# Patient Record
Sex: Female | Born: 1945 | Race: White | Hispanic: No | Smoking: Never smoker
Health system: Southern US, Community
[De-identification: ages and names within clinical notes are randomized; demographics above are authoritative.]

## PROBLEM LIST (undated history)

## (undated) DIAGNOSIS — Z8719 Personal history of other diseases of the digestive system: Secondary | ICD-10-CM

## (undated) DIAGNOSIS — M12811 Other specific arthropathies, not elsewhere classified, right shoulder: Secondary | ICD-10-CM

## (undated) DIAGNOSIS — M1990 Unspecified osteoarthritis, unspecified site: Secondary | ICD-10-CM

## (undated) DIAGNOSIS — N183 Chronic kidney disease, stage 3 unspecified: Secondary | ICD-10-CM

## (undated) DIAGNOSIS — K219 Gastro-esophageal reflux disease without esophagitis: Secondary | ICD-10-CM

## (undated) DIAGNOSIS — I2510 Atherosclerotic heart disease of native coronary artery without angina pectoris: Secondary | ICD-10-CM

## (undated) DIAGNOSIS — I10 Essential (primary) hypertension: Secondary | ICD-10-CM

## (undated) DIAGNOSIS — I350 Nonrheumatic aortic (valve) stenosis: Secondary | ICD-10-CM

## (undated) DIAGNOSIS — E7800 Pure hypercholesterolemia, unspecified: Secondary | ICD-10-CM

## (undated) DIAGNOSIS — D509 Iron deficiency anemia, unspecified: Secondary | ICD-10-CM

## (undated) DIAGNOSIS — R195 Other fecal abnormalities: Secondary | ICD-10-CM

## (undated) DIAGNOSIS — R519 Headache, unspecified: Secondary | ICD-10-CM

## (undated) DIAGNOSIS — R51 Headache: Secondary | ICD-10-CM

## (undated) DIAGNOSIS — E119 Type 2 diabetes mellitus without complications: Secondary | ICD-10-CM

## (undated) HISTORY — DX: Other fecal abnormalities: R19.5

## (undated) HISTORY — DX: Type 2 diabetes mellitus without complications: E11.9

## (undated) HISTORY — PX: CHOLECYSTECTOMY: SHX55

## (undated) HISTORY — PX: REPLACEMENT TOTAL KNEE: SUR1224

## (undated) HISTORY — DX: Nonrheumatic aortic (valve) stenosis: I35.0

## (undated) HISTORY — PX: CORONARY ANGIOPLASTY: SHX604

## (undated) HISTORY — DX: Chronic kidney disease, stage 3 (moderate): N18.3

## (undated) HISTORY — DX: Essential (primary) hypertension: I10

## (undated) HISTORY — DX: Iron deficiency anemia, unspecified: D50.9

## (undated) HISTORY — DX: Gastro-esophageal reflux disease without esophagitis: K21.9

## (undated) HISTORY — DX: Pure hypercholesterolemia, unspecified: E78.00

## (undated) HISTORY — DX: Atherosclerotic heart disease of native coronary artery without angina pectoris: I25.10

## (undated) HISTORY — DX: Chronic kidney disease, stage 3 unspecified: N18.30

## (undated) HISTORY — PX: OTHER SURGICAL HISTORY: SHX169

---
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
## 2018-07-25 ENCOUNTER — Encounter (INDEPENDENT_AMBULATORY_CARE_PROVIDER_SITE_OTHER): Payer: Self-pay | Admitting: *Deleted

## 2018-07-25 ENCOUNTER — Encounter (INDEPENDENT_AMBULATORY_CARE_PROVIDER_SITE_OTHER): Payer: Self-pay | Admitting: Internal Medicine

## 2018-07-25 ENCOUNTER — Telehealth (INDEPENDENT_AMBULATORY_CARE_PROVIDER_SITE_OTHER): Payer: Self-pay | Admitting: *Deleted

## 2018-07-25 ENCOUNTER — Ambulatory Visit (INDEPENDENT_AMBULATORY_CARE_PROVIDER_SITE_OTHER): Payer: Medicare Other | Admitting: Internal Medicine

## 2018-07-25 DIAGNOSIS — R195 Other fecal abnormalities: Secondary | ICD-10-CM | POA: Diagnosis not present

## 2018-07-25 HISTORY — DX: Other fecal abnormalities: R19.5

## 2018-07-25 MED ORDER — SUPREP BOWEL PREP KIT 17.5-3.13-1.6 GM/177ML PO SOLN: 1.0000 | Freq: Once | ORAL | 0 refills | Status: AC

## 2018-07-25 NOTE — Telephone Encounter (Signed)
Per Gweneth Dimitri, PA it is ok for patient to stop Plavix 5 days prior to TCS but continue ASA, patient aware

## 2018-07-25 NOTE — Progress Notes (Signed)
   Subjective:    Patient ID: Kimberly Baldwin, female    DOB: 01-16-1946, 72 y.o.   MRN: [id number]  HPI Referred by Dr. Leana Roe for heme positive stool. She says she has not seen any blood in her stools. No change in her stools. Her appetite is good. No weight loss. No abdominal pain. No family hx of colon cancer. She does say she has hemorrhoids. Her last colonoscopy 17 yrs ago Dr. Aleene Davidson and was normal per patient.   Cardiac stent and maintained on Plavix Diabetic x 30 yrs or better. HA1C 5.7 last one  Retired from [employer].   Review of Systems Past Medical History:  Diagnosis Date  . Aortic valve stenosis   . CAD (coronary artery disease)   . CRF (chronic renal failure), stage 3 (moderate) (HCC)   . Diabetes (HCC)   . GERD (gastroesophageal reflux disease)   . Guaiac positive stools 07/25/2018  . High cholesterol   . Hypertension   . IDA (iron deficiency anemia)       Allergies  Allergen Reactions  . Diclofenac     nausea  . Mobic [Meloxicam]     nauseated    No current outpatient medications on file prior to visit.   No current facility-administered medications on file prior to visit.         Objective:   Physical Exam Blood pressure 140/78, pulse 64, temperature 97.9 F (36.6 C), height 4\' 11"  (1.499 m), weight 196 lb 6.4 oz (89.1 kg). Alert and oriented. Skin warm and dry. Oral mucosa is moist.   . Sclera anicteric, conjunctivae is pink. Thyroid not enlarged. No cervical lymphadenopathy. Lungs clear. Heart regular rate and rhythm.  Abdomen is soft. Bowel sounds are positive. No hepatomegaly. No abdominal masses felt. No tenderness.  No edema to lower extremities.           Assessment & Plan:  Guaiac positive stool. Colonic neoplasm needs to be ruled out. Colonoscopy.

## 2018-07-25 NOTE — Telephone Encounter (Signed)
Patient needs suprep 

## 2018-07-25 NOTE — Patient Instructions (Signed)
The risks of bleeding, perforation and infection were reviewed with patient.  

## 2018-09-27 ENCOUNTER — Encounter (HOSPITAL_COMMUNITY)
Admission: RE | Disposition: A | Discharge status: Home / Self Care | Payer: Self-pay | Source: Ambulatory Visit | Attending: Internal Medicine

## 2018-09-27 ENCOUNTER — Encounter (HOSPITAL_COMMUNITY): Payer: Self-pay | Admitting: *Deleted

## 2018-09-27 ENCOUNTER — Other Ambulatory Visit: Payer: Self-pay

## 2018-09-27 ENCOUNTER — Ambulatory Visit (HOSPITAL_COMMUNITY)
Admission: RE | Admit: 2018-09-27 | Discharge: 2018-09-27 | Disposition: A | Discharge status: Home / Self Care | Payer: Medicare Other | Source: Ambulatory Visit | Attending: Internal Medicine | Admitting: Internal Medicine

## 2018-09-27 DIAGNOSIS — K219 Gastro-esophageal reflux disease without esophagitis: Secondary | ICD-10-CM | POA: Diagnosis not present

## 2018-09-27 DIAGNOSIS — Z7902 Long term (current) use of antithrombotics/antiplatelets: Secondary | ICD-10-CM | POA: Insufficient documentation

## 2018-09-27 DIAGNOSIS — M1990 Unspecified osteoarthritis, unspecified site: Secondary | ICD-10-CM | POA: Insufficient documentation

## 2018-09-27 DIAGNOSIS — N183 Chronic kidney disease, stage 3 (moderate): Secondary | ICD-10-CM | POA: Diagnosis not present

## 2018-09-27 DIAGNOSIS — I129 Hypertensive chronic kidney disease with stage 1 through stage 4 chronic kidney disease, or unspecified chronic kidney disease: Secondary | ICD-10-CM | POA: Insufficient documentation

## 2018-09-27 DIAGNOSIS — Z7982 Long term (current) use of aspirin: Secondary | ICD-10-CM | POA: Insufficient documentation

## 2018-09-27 DIAGNOSIS — E7800 Pure hypercholesterolemia, unspecified: Secondary | ICD-10-CM | POA: Insufficient documentation

## 2018-09-27 DIAGNOSIS — R195 Other fecal abnormalities: Secondary | ICD-10-CM | POA: Insufficient documentation

## 2018-09-27 DIAGNOSIS — E1122 Type 2 diabetes mellitus with diabetic chronic kidney disease: Secondary | ICD-10-CM | POA: Insufficient documentation

## 2018-09-27 DIAGNOSIS — I2510 Atherosclerotic heart disease of native coronary artery without angina pectoris: Secondary | ICD-10-CM | POA: Diagnosis not present

## 2018-09-27 DIAGNOSIS — K644 Residual hemorrhoidal skin tags: Secondary | ICD-10-CM

## 2018-09-27 HISTORY — PX: COLONOSCOPY: SHX5424

## 2018-09-27 HISTORY — DX: Unspecified osteoarthritis, unspecified site: M19.90

## 2018-09-27 HISTORY — DX: Personal history of other diseases of the digestive system: Z87.19

## 2018-09-27 HISTORY — DX: Headache, unspecified: R51.9

## 2018-09-27 HISTORY — DX: Headache: R51

## 2018-09-27 LAB — GLUCOSE, CAPILLARY: Glucose-Capillary: 132 mg/dL — ABNORMAL HIGH (ref 70–99)

## 2018-09-27 SURGERY — COLONOSCOPY
Anesthesia: Moderate Sedation

## 2018-09-27 MED ORDER — SODIUM CHLORIDE 0.9 % IV SOLN
INTRAVENOUS | Status: DC
  Administered 2018-09-27: 1000 mL via INTRAVENOUS

## 2018-09-27 MED ORDER — MEPERIDINE HCL 50 MG/ML IJ SOLN
INTRAMUSCULAR | Status: AC
  Filled 2018-09-27: qty 1

## 2018-09-27 MED ORDER — MIDAZOLAM HCL 5 MG/5ML IJ SOLN
INTRAMUSCULAR | Status: AC
  Filled 2018-09-27: qty 10

## 2018-09-27 MED ORDER — STERILE WATER FOR IRRIGATION IR SOLN
Status: DC | PRN
  Administered 2018-09-27: 1.5 mL

## 2018-09-27 MED ORDER — MEPERIDINE HCL 50 MG/ML IJ SOLN
INTRAMUSCULAR | Status: DC | PRN
  Administered 2018-09-27 (×2): 25 mg

## 2018-09-27 MED ORDER — MIDAZOLAM HCL 5 MG/5ML IJ SOLN
INTRAMUSCULAR | Status: DC | PRN
  Administered 2018-09-27: 1 mg via INTRAVENOUS
  Administered 2018-09-27: 2 mg via INTRAVENOUS
  Administered 2018-09-27: 1 mg via INTRAVENOUS
  Administered 2018-09-27: 2 mg via INTRAVENOUS

## 2018-09-27 NOTE — Discharge Instructions (Signed)
Resume medications including aspirin and clopidogrel/Plavix as before. Resume usual diet. No driving for 24 hours.  PATIENT INSTRUCTIONS POST-ANESTHESIA  IMMEDIATELY FOLLOWING SURGERY:  Do not drive or operate machinery for the first twenty four hours after surgery.  Do not make any important decisions for twenty four hours after surgery or while taking narcotic pain medications or sedatives.  If you develop intractable nausea and vomiting or a severe headache please notify your doctor immediately.  FOLLOW-UP:  Please make an appointment with your surgeon as instructed. You do not need to follow up with anesthesia unless specifically instructed to do so.  WOUND CARE INSTRUCTIONS (if applicable):  Keep a dry clean dressing on the anesthesia/puncture wound site if there is drainage.  Once the wound has quit draining you may leave it open to air.  Generally you should leave the bandage intact for twenty four hours unless there is drainage.  If the epidural site drains for more than 36-48 hours please call the anesthesia department.  QUESTIONS?:  Please feel free to call your physician or the hospital operator if you have any questions, and they will be happy to assist you.      Colonoscopy, Adult, Care After This sheet gives you information about how to care for yourself after your procedure. Your doctor may also give you more specific instructions. If you have problems or questions, call your doctor. Follow these instructions at home: General instructions   For the first 24 hours after the procedure: ? Do not drive or use machinery. ? Do not sign important documents. ? Do not drink alcohol. ? Do your daily activities more slowly than normal. ? Eat foods that are soft and easy to digest. ? Rest often.  Take over-the-counter or prescription medicines only as told by your doctor.  It is up to you to get the results of your procedure. Ask your doctor, or the department performing the  procedure, when your results will be ready. To help cramping and bloating:  Try walking around.  Put heat on your belly (abdomen) as told by your doctor. Use a heat source that your doctor recommends, such as a moist heat pack or a heating pad. ? Put a towel between your skin and the heat source. ? Leave the heat on for 20-30 minutes. ? Remove the heat if your skin turns bright red. This is especially important if you cannot feel pain, heat, or cold. You can get burned. Eating and drinking  Drink enough fluid to keep your pee (urine) clear or pale yellow.  Return to your normal diet as told by your doctor. Avoid heavy or fried foods that are hard to digest.  Avoid drinking alcohol for as long as told by your doctor. Contact a doctor if:  You have blood in your poop (stool) 2-3 days after the procedure. Get help right away if:  You have more than a small amount of blood in your poop.  You see large clumps of tissue (blood clots) in your poop.  Your belly is swollen.  You feel sick to your stomach (nauseous).  You throw up (vomit).  You have a fever.  You have belly pain that gets worse, and medicine does not help your pain. This information is not intended to replace advice given to you by your health care provider. Make sure you discuss any questions you have with your health care provider. Document Released: 12/03/2010 Document Revised: 07/25/2016 Document Reviewed: 07/25/2016 Elsevier Interactive Patient Education  2017 Elsevier  Inc[employer]   Hemorrhoids Hemorrhoids are swollen veins in and around the rectum or anus. Hemorrhoids can cause pain, itching, or bleeding. Most of the time, they do not cause serious problems. They usually get better with diet changes, lifestyle changes, and other home treatments. Follow these instructions at home: Eating and drinking  Eat foods that have fiber, such as whole grains, beans, nuts, fruits, and vegetables. Ask your doctor about taking  products that have added fiber (fibersupplements).  Drink enough fluid to keep your pee (urine) clear or pale yellow. For Pain and Swelling  Take a warm-water bath (sitz bath) for 20 minutes to ease pain. Do this 3-4 times a day.  If directed, put ice on the painful area. It may be helpful to use ice between your warm baths. ? Put ice in a plastic bag. ? Place a towel between your skin and the bag. ? Leave the ice on for 20 minutes, 2-3 times a day. General instructions  Take over-the-counter and prescription medicines only as told by your doctor. ? Medicated creams and medicines that are inserted into the anus (suppositories) may be used or applied as told.  Exercise often.  Go to the bathroom when you have the urge to poop (to have a bowel movement). Do not wait.  Avoid pushing too hard (straining) when you poop.  Keep the butt area dry and clean. Use wet toilet paper or moist paper towels.  Do not sit on the toilet for a long time. Contact a doctor if:  You have any of these: ? Pain and swelling that do not get better with treatment or medicine. ? Bleeding that will not stop. ? Trouble pooping or you cannot poop. ? Pain or swelling outside the area of the hemorrhoids. This information is not intended to replace advice given to you by your health care provider. Make sure you discuss any questions you have with your health care provider. Document Released: 08/09/2008 Document Revised: 04/07/2016 Document Reviewed: 07/15/2015 Elsevier Interactive Patient Education  [employer].

## 2018-09-27 NOTE — H&P (Signed)
Kimberly Baldwin is an 72 y.o. female.   Chief Complaint: Patient is here for colonoscopy. HPI: Patient is 72 year old Caucasian female who is here for diagnostic colonoscopy for heme positive stool.  Last colonoscopy was normal at 17 years ago.  She denies abdominal pain change in bowel habits or rectal bleeding.  Is no history of peptic ulcer disease.  She is on low-dose aspirin and Plavix and both of these medications are on hold.  She states she has been eating too many blueberries and wonders if so it was false positive. Recent H&H was normal according to patient. Family history is negative for CRC.  Past Medical History:  Diagnosis Date  . Aortic valve stenosis   . Arthritis   . CAD (coronary artery disease)   . CRF (chronic renal failure), stage 3 (moderate) (HCC)   . Diabetes (HCC)   . GERD (gastroesophageal reflux disease)   .  07/25/2018  . Headache   . High cholesterol       . Hypertension         Past Surgical History:  Procedure Laterality Date  . Cataract surgery    . CHOLECYSTECTOMY    . complete hyste    . CORONARY ANGIOPLASTY    . REPLACEMENT TOTAL KNEE     both knee, 2010, 2015    History reviewed. No pertinent family history. Social History:  reports that she has never smoked. She has never used smokeless tobacco. She reports that she does not drink alcohol or use drugs.  Allergies:  Allergies  Allergen Reactions  . Diclofenac     nausea  . Mobic [Meloxicam]     nauseated    Medications Prior to Admission  Medication Sig Dispense Refill  . acetaminophen (TYLENOL) 500 MG tablet Take 1,000 mg by mouth 2 (two) times daily before a meal.    . allopurinol (ZYLOPRIM) 100 MG tablet Take 50 mg by mouth daily.    Marland Kitchen amLODipine (NORVASC) 10 MG tablet Take 10 mg by mouth daily.    Marland Kitchen aspirin EC 81 MG tablet Take 81 mg by mouth daily.    Marland Kitchen atorvastatin (LIPITOR) 40 MG tablet Take 40 mg by mouth daily.    . benazepril (LOTENSIN) 20 MG tablet Take 20 mg by mouth  daily.    . Calcium Carbonate-Vit D-Min (CALCIUM 1200 PO) Take 600 mg by mouth 2 (two) times daily.     . carvedilol (COREG) 25 MG tablet Take 25 mg by mouth 2 (two) times daily with a meal.    . clopidogrel (PLAVIX) 75 MG tablet Take 75 mg by mouth daily.    . fluticasone (VERAMYST) 27.5 MCG/SPRAY nasal spray Place 2 sprays into the nose as needed for rhinitis.    . folic acid (FOLVITE) 1 MG tablet Take 1 mg by mouth daily.    Marland Kitchen GARLIC PO Take 1 mg by mouth daily.    Marland Kitchen liraglutide (VICTOZA) 18 MG/3ML SOPN Inject 0.6 mg into the skin daily.    . magnesium oxide (MAG-OX) 400 MG tablet Take 400 mg by mouth daily.    . Multiple Vitamin (MULTIVITAMIN) tablet Take 1 tablet by mouth daily.    . Omega-3 Fatty Acids (FISH OIL) 1000 MG CAPS Take 2 capsules by mouth 2 (two) times daily.     . pioglitazone (ACTOS) 30 MG tablet Take 15 mg by mouth 2 (two) times daily.     . ranitidine (ZANTAC) 150 MG capsule Take 150 mg by mouth daily.     Marland Kitchen  torsemide (DEMADEX) 20 MG tablet Take 20 mg by mouth daily.    . traMADol (ULTRAM) 50 MG tablet Take 50 mg by mouth daily.     . vitamin B-12 (CYANOCOBALAMIN) 500 MCG tablet Take 500 mcg by mouth daily.     . vitamin E (VITAMIN E) 400 UNIT capsule Take 400 Units by mouth daily.    Marland Kitchen. azithromycin (ZITHROMAX) 250 MG tablet Take by mouth as needed. Dental procedures.      Results for orders placed or performed during the hospital encounter of 09/27/18 (from the past 48 hour(s))  Glucose, capillary     Status: Abnormal   Collection Time: 09/27/18  9:02 AM  Result Value Ref Range   Glucose-Capillary 132 (H) 70 - 99 mg/dL   No results found.  ROS  Blood pressure (!) 157/79, pulse 61, temperature 97.7 F (36.5 C), temperature source Oral, resp. rate 11, height 4\' 11"  (1.499 m), weight 90.7 kg, SpO2 100 %. Physical Exam  Constitutional: She appears well-developed and well-nourished.  HENT:  Mouth/Throat: Oropharynx is clear and moist.  Eyes: Conjunctivae are  normal. No scleral icterus.  Neck: No thyromegaly present.  Cardiovascular: Normal rate and regular rhythm.  Murmur heard. Faint systolic ejection murmur best heard at left upper sternal border.  Respiratory: Effort normal and breath sounds normal.  GI:  Abdomen is full but soft and nontender with organomegaly or masses.  Musculoskeletal: She exhibits edema.  Trace edema around ankles.  Lymphadenopathy:    She has no cervical adenopathy.  Neurological: She is alert.  Skin: Skin is warm.     Assessment/Plan Heme positive stool. Diagnostic colonoscopy.  Lionel DecemberNajeeb , MD 09/27/2018, 9:43 AM

## 2018-09-27 NOTE — Op Note (Signed)
[facility] Patient Name: Kimberly FlakeBarbara Tirone Procedure Date: 09/27/2018 8:51 AM MRN: [id number] Date of Birth: February 19, 1946 Attending MD: Lionel DecemberNajeeb Rehman , MD CSN: [id number] Age: 72 Admit Type: Outpatient Procedure:                Colonoscopy Indications:              Heme positive stool Providers:                Lionel DecemberNajeeb Rehman, MD, Nena PolioLisa Moore, RN, Sterling Bigiffani Roberts,                            RN Referring MD:             Leana RoeStacy Lahti, DO Medicines:                Meperidine 50 mg IV, Midazolam 6 mg IV Complications:            No immediate complications. Estimated Blood Loss:     Estimated blood loss: none. Procedure:                Pre-Anesthesia Assessment:                           - Prior to the procedure, a History and Physical                            was performed, and patient medications and                            allergies were reviewed. The patient's tolerance of                            previous anesthesia was also reviewed. The risks                            and benefits of the procedure and the sedation                            options and risks were discussed with the patient.                            All questions were answered, and informed consent                            was obtained. Prior Anticoagulants: The patient                            last took aspirin 2 days and Plavix (clopidogrel) 6                            days prior to the procedure. ASA Grade Assessment:                            III - A patient with severe systemic disease. After  reviewing the risks and benefits, the patient was                            deemed in satisfactory condition to undergo the                            procedure.                           After obtaining informed consent, the colonoscope                            was passed under direct vision. Throughout the                            procedure, the patient's blood pressure,  pulse, and                            oxygen saturations were monitored continuously. The                            [id number]-[id number] ([id number]) scope was introduced through                            the anus and advanced to the the cecum, identified                            by appendiceal orifice and ileocecal valve. The                            colonoscopy was performed without difficulty. The                            patient tolerated the procedure well. The quality                            of the bowel preparation was excellent. The                            ileocecal valve, appendiceal orifice, and rectum                            were photographed. Scope In: 9:54:54 AM Scope Out: 10:07:45 AM Scope Withdrawal Time: 0 hours 8 minutes 0 seconds  Total Procedure Duration: 0 hours 12 minutes 51 seconds  Findings:      Skin tags were found on perianal exam.      The colon (entire examined portion) appeared normal.      External hemorrhoids were found during retroflexion. The hemorrhoids       were small. Impression:               - Perianal skin tags found on perianal exam.                           - The entire examined colon is normal.                           -  External hemorrhoids.                           - No specimens collected. Moderate Sedation:      Moderate (conscious) sedation was administered by the endoscopy nurse       and supervised by the endoscopist. The following parameters were       monitored: oxygen saturation, heart rate, blood pressure, CO2       capnography and response to care. Total physician intraservice time was       19 minutes. Recommendation:           - Patient has a contact number available for                            emergencies. The signs and symptoms of potential                            delayed complications were discussed with the                            patient. Return to normal activities tomorrow.                             Written discharge instructions were provided to the                            patient.                           - Resume previous diet today.                           - Continue present medications.                           - Resume aspirin today and Plavix (clopidogrel)                            today at prior doses.                           - No repeat colonoscopy due to age and the absence                            of advanced adenomas. Procedure Code(s):        --- Professional ---                           [phone], Colonoscopy, flexible; diagnostic, including                            collection of specimen(s) by brushing or washing,                            when performed (separate procedure)  G0500, Moderate sedation services provided by the                            same physician or other qualified health care                            professional performing a gastrointestinal                            endoscopic service that sedation supports,                            requiring the presence of an independent trained                            observer to assist in the monitoring of the                            patient's level of consciousness and physiological                            status; initial 15 minutes of intra-service time;                            patient age 58 years or older (additional time may                            be reported with 40981, as appropriate) Diagnosis Code(s):        --- Professional ---                           K64.4, Residual hemorrhoidal skin tags                           R19.5, Other fecal abnormalities CPT copyright 2018 American Medical Association. All rights reserved. The codes documented in this report are preliminary and upon coder review may  be revised to meet current compliance requirements. Lionel December, MD Lionel December, MD 09/27/2018 10:17:34 AM This report has been signed  electronically. Number of Addenda: 0

## 2018-10-03 ENCOUNTER — Encounter (HOSPITAL_COMMUNITY): Payer: Self-pay | Admitting: Internal Medicine

## 2019-01-02 ENCOUNTER — Other Ambulatory Visit: Payer: Self-pay | Admitting: Orthopedic Surgery

## 2019-01-02 DIAGNOSIS — M25511 Pain in right shoulder: Secondary | ICD-10-CM

## 2019-01-08 ENCOUNTER — Ambulatory Visit
Admission: RE | Admit: 2019-01-08 | Discharge: 2019-01-08 | Disposition: A | Discharge status: Home / Self Care | Payer: Medicare Other | Source: Ambulatory Visit | Attending: Orthopedic Surgery | Admitting: Orthopedic Surgery

## 2019-01-08 DIAGNOSIS — M25511 Pain in right shoulder: Secondary | ICD-10-CM

## 2019-05-31 ENCOUNTER — Other Ambulatory Visit (HOSPITAL_COMMUNITY): Payer: Self-pay | Admitting: *Deleted

## 2019-05-31 ENCOUNTER — Other Ambulatory Visit: Payer: Self-pay | Admitting: Orthopedic Surgery

## 2019-05-31 NOTE — Care Plan (Signed)
Spoke with patient prior to surgery. She plans to discharge to home with family to assist. Her post op rehab will be determined at the office at the appropriate time. She has no HH or DME needs.   Ladell Heads, [place]

## 2019-05-31 NOTE — Progress Notes (Signed)
12-27-18 (Epic) Cardiac clearance from Alinda Sierras, [facility]   12-27-18 (Chart Everywhere) EKG

## 2019-05-31 NOTE — Progress Notes (Signed)
Pt scheduled for her PAT on 06-03-19. Please place surgical orders. Thank you

## 2019-05-31 NOTE — Patient Instructions (Addendum)
YOU NEED TO HAVE A COVID 19 TEST ON_______ @_______ , THIS TEST MUST BE DONE BEFORE SURGERY, [facility]. ONCE YOUR COVID TEST IS COMPLETED, PLEASE BEGIN THE QUARANTINE INSTRUCTIONS AS OUTLINED IN YOUR HANDOUT.                Kimberly PonderBarbara T Baldwin  05/31/2019   Your procedure is scheduled on: 06-11-19   [facility]  [facility]    Report to Short Stay at 5:30 AM     Call this number if you have problems the morning of surgery [phone]    Remember: Do not eat food or drink liquids :After Midnight.      Take these medicines the morning of surgery with A SIP OF WATER: Allopurinol (Zyloprim), Amlodipine (Norvasc), Atorvastatin, Carvedilol (Coreg), and Famotidine (Pepcid)  BRUSH YOUR TEETH MORNING OF SURGERY AND RINSE YOUR MOUTH OUT, NO CHEWING GUM CANDY OR MINTS.                              You may not have any metal on your body including hair pins and              piercings     Do not wear jewelry, make-up, lotions, powders or perfumes, deodorant              Do not wear nail polish.  Do not shave  48 hours prior to surgery.     Do not bring valuables to the hospital. [place] IS NOT             RESPONSIBLE   FOR VALUABLES.  Contacts, dentures or bridgework may not be worn into surgery.    Special Instructions: N/A              Please read over the following fact sheets you were given: _____________________________________________________________________             How to Manage Your Diabetes Before and After Surgery  Why is it important to control my blood sugar before and after surgery? . Improving blood sugar levels before and after surgery helps healing and can limit problems. . A way of improving blood sugar control is eating a healthy diet by: o  Eating less sugar and carbohydrates o  Increasing activity/exercise o  Talking with your doctor about reaching your blood sugar goals . High blood  sugars (greater than 180 mg/dL) can raise your risk of infections and slow your recovery, so you will need to focus on controlling your diabetes during the weeks before surgery. . Make sure that the doctor who takes care of your diabetes knows about your planned surgery including the date and location.  How do I manage my blood sugar before surgery? . Check your blood sugar at least 4 times a day, starting 2 days before surgery, to make sure that the level is not too high or low. o Check your blood sugar the morning of your surgery when you wake up and every 2 hours until you get to the Short Stay unit. . If your blood sugar is less than 70 mg/dL, you will need to treat for low blood sugar: o Do not take insulin. o Treat a low blood sugar (less than 70 mg/dL) with  cup of clear juice (cranberry or apple), 4 glucose tablets, OR glucose gel. o Recheck blood sugar in 15 minutes after treatment (to  make sure it is greater than 70 mg/dL). If your blood sugar is not greater than 70 mg/dL on recheck, call [phone] for further instructions. . Report your blood sugar to the short stay nurse when you get to Short Stay.  . If you are admitted to the hospital after surgery: o Your blood sugar will be checked by the staff and you will probably be given insulin after surgery (instead of oral diabetes medicines) to make sure you have good blood sugar levels. o The goal for blood sugar control after surgery is 80-180 mg/dL.   WHAT DO I DO ABOUT MY DIABETES MEDICATION?  Marland Kitchen Do not take oral diabetes medicines (pills) the morning of surgery.  THE DAY BEFORE SURGERY, take your usual Actos and Victoza      . The day of surgery, do not take other diabetes injectables, including Byetta (exenatide), Bydureon (exenatide ER), Victoza (liraglutide), or Trulicity (dulaglutide).   DO NOT TAKE ANY DIABETIC MEDICATIONS DAY OF YOUR [place] - Preparing for Surgery Before surgery, you can play an  important role.  Because skin is not sterile, your skin needs to be as free of germs as possible.  You can reduce the number of germs on your skin by washing with CHG (chlorahexidine gluconate) soap before surgery.  CHG is an antiseptic cleaner which kills germs and bonds with the skin to continue killing germs even after washing. Please DO NOT use if you have an allergy to CHG or antibacterial soaps.  If your skin becomes reddened/irritated stop using the CHG and inform your nurse when you arrive at Short Stay. Do not shave (including legs and underarms) for at least 48 hours prior to the first CHG shower.  You may shave your face/neck. Please follow these instructions carefully:  1.  Shower with CHG Soap the night before surgery and the  morning of Surgery.  2.  If you choose to wash your hair, wash your hair first as usual with your  normal  shampoo.  3.  After you shampoo, rinse your hair and body thoroughly to remove the  shampoo.                           4.  Use CHG as you would any other liquid soap.  You can apply chg directly  to the skin and wash                       Gently with a scrungie or clean washcloth.  5.  Apply the CHG Soap to your body ONLY FROM THE NECK DOWN.   Do not use on face/ open                           Wound or open sores. Avoid contact with eyes, ears mouth and genitals (private parts).                       Wash face,  Genitals (private parts) with your normal soap.             6.  Wash thoroughly, paying special attention to the area where your surgery  will be performed.  7.  Thoroughly rinse your body with warm water from the neck down.  8.  DO NOT shower/wash with your normal soap after using and rinsing off  the CHG Soap.  9.  Pat yourself dry with a clean towel.            10.  Wear clean pajamas.            11.  Place clean sheets on your bed the night of your first shower and do not  sleep with pets. Day of Surgery : Do not apply any  lotions/deodorants the morning of surgery.  Please wear clean clothes to the hospital/surgery center.  FAILURE TO FOLLOW THESE INSTRUCTIONS MAY RESULT IN THE CANCELLATION OF YOUR SURGERY PATIENT SIGNATURE_________________________________  NURSE SIGNATURE__________________________________  ________________________________________________________________________

## 2019-06-03 ENCOUNTER — Other Ambulatory Visit: Payer: Self-pay

## 2019-06-03 ENCOUNTER — Encounter (HOSPITAL_COMMUNITY): Admission: RE | Admit: 2019-06-03 | Payer: Medicare Other | Source: Ambulatory Visit

## 2019-06-03 ENCOUNTER — Encounter (HOSPITAL_COMMUNITY)
Admission: RE | Admit: 2019-06-03 | Discharge: 2019-06-03 | Disposition: A | Discharge status: Home / Self Care | Payer: Medicare Other | Source: Ambulatory Visit | Attending: Orthopedic Surgery | Admitting: Orthopedic Surgery

## 2019-06-03 ENCOUNTER — Encounter (HOSPITAL_COMMUNITY): Payer: Self-pay

## 2019-06-03 DIAGNOSIS — R9431 Abnormal electrocardiogram [ECG] [EKG]: Secondary | ICD-10-CM | POA: Insufficient documentation

## 2019-06-03 DIAGNOSIS — R001 Bradycardia, unspecified: Secondary | ICD-10-CM | POA: Diagnosis not present

## 2019-06-03 DIAGNOSIS — Z01818 Encounter for other preprocedural examination: Secondary | ICD-10-CM | POA: Insufficient documentation

## 2019-06-03 DIAGNOSIS — E119 Type 2 diabetes mellitus without complications: Secondary | ICD-10-CM | POA: Diagnosis not present

## 2019-06-03 LAB — BASIC METABOLIC PANEL
Anion gap: 9 (ref 5–15)
BUN: 24 mg/dL — ABNORMAL HIGH (ref 8–23)
CO2: 30 mmol/L (ref 22–32)
Calcium: 10.4 mg/dL — ABNORMAL HIGH (ref 8.9–10.3)
Chloride: 103 mmol/L (ref 98–111)
Creatinine, Ser: 1.7 mg/dL — ABNORMAL HIGH (ref 0.44–1.00)
GFR calc Af Amer: 34 mL/min — ABNORMAL LOW (ref >60)
GFR calc non Af Amer: 29 mL/min — ABNORMAL LOW (ref >60)
Glucose, Bld: 101 mg/dL — ABNORMAL HIGH (ref 70–99)
Potassium: 4.7 mmol/L (ref 3.5–5.1)
Sodium: 142 mmol/L (ref 135–145)

## 2019-06-03 LAB — CBC
HCT: 41.2 % (ref 36.0–46.0)
Hemoglobin: 12.7 g/dL (ref 12.0–15.0)
MCH: 32.7 pg (ref 26.0–34.0)
MCHC: 30.8 g/dL (ref 30.0–36.0)
MCV: 106.2 fL — ABNORMAL HIGH (ref 80.0–100.0)
Platelets: 194 10*3/uL (ref 150–400)
RBC: 3.88 MIL/uL (ref 3.87–5.11)
RDW: 13.9 % (ref 11.5–15.5)
WBC: 6.2 10*3/uL (ref 4.0–10.5)
nRBC: 0 % (ref 0.0–0.2)

## 2019-06-03 LAB — HEMOGLOBIN A1C
Hgb A1c MFr Bld: 5.8 % — ABNORMAL HIGH (ref 4.8–5.6)
Mean Plasma Glucose: 119.76 mg/dL

## 2019-06-03 LAB — SURGICAL PCR SCREEN
MRSA, PCR: NEGATIVE
Staphylococcus aureus: POSITIVE — AB

## 2019-06-06 NOTE — Progress Notes (Signed)
Anesthesia Chart Review   Case: [id number] Date/Time: 06/11/19 0715   Procedure: REVERSE SHOULDER ARTHROPLASTY (Right )   Anesthesia type: Choice   Pre-op diagnosis: DJD RIGHT SHOULDER   Location: [facility] ROOM 07 / [facility]   Surgeon: Teryl LucyLandau, Joshua, MD      DISCUSSION:73 y.o. never smoker with h/o DM ii, CAD, chronic diastolic heart failure, chronic renal failure stage III (creatinine stable), HTN, GERD, right shoulder DJD scheduled for above procedure 06/11/2019 with Dr. Teryl LucyJoshua Landau.   Pt last seen by cardiology 12/27/2018.  Seen by Gweneth DimitriJulie Marshall, PA-C.  Per OV note, "If you have shoulder surgery, this is acceptable from a cardiac standpoint. You do not need heart testing prior to this potential surgery. You may stop plavix 7 days before this surgery, and restart 1-2 days after surgery with consent from the surgical team. Please continue aspirin 81 mg once daily. If questions about this plan, fax [phone]."  Anticipate pt can proceed with planned procedure barring acute status change.   VS: BP (!) 164/71 (BP Location: Left Arm)   Pulse (!) 54   Temp 36.9 C (Oral)   Resp 18   Ht 5' (1.524 m)   Wt 90 kg   LMP  (LMP Unknown)   SpO2 98%   BMI 38.75 kg/m   PROVIDERS: Pickeral, Tennis MustElizabeth M, FNP is PCP    LABS: Labs reviewed: Acceptable for surgery. (all labs ordered are listed, but only abnormal results are displayed)  Labs Reviewed  SURGICAL PCR SCREEN - Abnormal; Notable for the following components:      Result Value   Staphylococcus aureus POSITIVE (*)    All other components within normal limits  BASIC METABOLIC PANEL - Abnormal; Notable for the following components:   Glucose, Bld 101 (*)    BUN 24 (*)    Creatinine, Ser 1.70 (*)    Calcium 10.4 (*)    GFR calc non Af Amer 29 (*)    GFR calc Af Amer 34 (*)    All other components within normal limits  CBC - Abnormal; Notable for the following components:   MCV 106.2 (*)    All other components within normal limits   HEMOGLOBIN A1C - Abnormal; Notable for the following components:   Hgb A1c MFr Bld 5.8 (*)    All other components within normal limits     IMAGES:   EKG:   CV: Echo 07/24/2018 (Care Everywhere) INTERPRETATION ---------------------------------------------------------------   NORMAL LEFT VENTRICULAR FUNCTION WITH MILD LVH   NORMAL LA PRESSURES WITH DIASTOLIC DYSFUNCTION   NORMAL RIGHT VENTRICULAR SYSTOLIC FUNCTION   VALVULAR REGURGITATION: TRIVIAL AR, TRIVIAL MR, MILD PR, TRIVIAL TR   VALVULAR STENOSIS: TRIVIAL AS Past Medical History:  Diagnosis Date  . Aortic valve stenosis   . Arthritis   . CAD (coronary artery disease)   . CRF (chronic renal failure), stage 3 (moderate) (HCC)   . Diabetes (HCC)   . GERD (gastroesophageal reflux disease)   . Guaiac positive stools 07/25/2018  . Headache   . High cholesterol   . History of hiatal hernia   . Hypertension   . IDA (iron deficiency anemia)     Past Surgical History:  Procedure Laterality Date  . Cataract surgery    . CHOLECYSTECTOMY    . COLONOSCOPY N/A 09/27/2018   Procedure: COLONOSCOPY;  Surgeon: Malissa Hippoehman, Najeeb U, MD;  Location: [facility];  Service: Endoscopy;  Laterality: N/A;  2:00  . complete hyste    . CORONARY  ANGIOPLASTY    . REPLACEMENT TOTAL KNEE     both knee, 2010, 2015    MEDICATIONS: . acetaminophen (TYLENOL) 500 MG tablet  . allopurinol (ZYLOPRIM) 100 MG tablet  . amLODipine (NORVASC) 10 MG tablet  . aspirin EC 81 MG tablet  . atorvastatin (LIPITOR) 40 MG tablet  . azithromycin (ZITHROMAX) 250 MG tablet  . benazepril (LOTENSIN) 20 MG tablet  . Calcium Carbonate-Vit D-Min (CALCIUM 1200 PO)  . carvedilol (COREG) 25 MG tablet  . clopidogrel (PLAVIX) 75 MG tablet  . famotidine (PEPCID) 10 MG tablet  . fluticasone (VERAMYST) 27.5 MCG/SPRAY nasal spray  . folic acid (FOLVITE) 1 MG tablet  . GARLIC PO  . liraglutide (VICTOZA) 18 MG/3ML SOPN  . magnesium oxide (MAG-OX) 400 MG tablet  .  Multiple Vitamin (MULTIVITAMIN) tablet  . Omega-3 Fatty Acids (FISH OIL) 1000 MG CAPS  . pioglitazone (ACTOS) 30 MG tablet  . torsemide (DEMADEX) 20 MG tablet  . traMADol (ULTRAM) 50 MG tablet  . vitamin B-12 (CYANOCOBALAMIN) 500 MCG tablet   No current facility-administered medications for this encounter.    Maia Plan [facility] Pre-Surgical Testing [phone] 06/06/19  3:17 PM

## 2019-06-06 NOTE — Anesthesia Preprocedure Evaluation (Addendum)
Anesthesia Evaluation    Reviewed: Allergy & Precautions, Patient's Chart, lab work & pertinent test results, reviewed documented beta blocker date and time   Airway Mallampati: III  TM Distance: >3 FB Neck ROM: Full  Mouth opening: Limited Mouth Opening  Dental no notable dental hx. (+) Teeth Intact, Dental Advisory Given   Pulmonary neg pulmonary ROS,    Pulmonary exam normal breath sounds clear to auscultation       Cardiovascular hypertension, Pt. on medications and Pt. on home beta blockers + CAD  Normal cardiovascular exam Rhythm:Regular Rate:Normal  Echo 07/24/2018 (Care Everywhere) NORMAL LEFT VENTRICULAR FUNCTION WITH MILD LVH NORMAL LA PRESSURES WITH DIASTOLIC DYSFUNCTION NORMAL RIGHT VENTRICULAR SYSTOLIC FUNCTION VALVULAR REGURGITATION: TRIVIAL AR, TRIVIAL MR, MILD PR, TRIVIAL TR VALVULAR STENOSIS: TRIVIAL AS   Neuro/Psych negative neurological ROS  negative psych ROS   GI/Hepatic Neg liver ROS, hiatal hernia, GERD  ,  Endo/Other  negative endocrine ROSdiabetes, Oral Hypoglycemic Agents  Renal/GU Renal InsufficiencyRenal disease  negative genitourinary   Musculoskeletal  (+) Arthritis ,   Abdominal   Peds  Hematology  (+) Blood dyscrasia (on plavix), ,   Anesthesia Other Findings   Reproductive/Obstetrics                           Anesthesia Physical Anesthesia Plan  ASA: III  Anesthesia Plan: General and Regional   Post-op Pain Management:  Regional for Post-op pain   Induction: Intravenous  PONV Risk Score and Plan: 3 and Ondansetron, Dexamethasone and Treatment may vary due to age or medical condition  Airway Management Planned: Oral ETT  Additional Equipment:   Intra-op Plan:   Post-operative Plan: Extubation in OR  Informed Consent: I have reviewed the patients History and Physical, chart, labs and discussed the procedure including the risks, benefits and  alternatives for the proposed anesthesia with the patient or authorized representative who has indicated his/her understanding and acceptance.     Dental advisory given  Plan Discussed with: CRNA  Anesthesia Plan Comments:        Anesthesia Quick Evaluation

## 2019-06-07 ENCOUNTER — Other Ambulatory Visit (HOSPITAL_COMMUNITY)
Admission: RE | Admit: 2019-06-07 | Discharge: 2019-06-07 | Disposition: A | Discharge status: Home / Self Care | Payer: Medicare Other | Source: Ambulatory Visit | Attending: Orthopedic Surgery | Admitting: Orthopedic Surgery

## 2019-06-07 DIAGNOSIS — Z1159 Encounter for screening for other viral diseases: Secondary | ICD-10-CM | POA: Insufficient documentation

## 2019-06-08 LAB — SARS CORONAVIRUS 2 (TAT 6-24 HRS): SARS Coronavirus 2: NEGATIVE

## 2019-06-11 ENCOUNTER — Inpatient Hospital Stay (HOSPITAL_COMMUNITY): Payer: Medicare Other | Admitting: Anesthesiology

## 2019-06-11 ENCOUNTER — Inpatient Hospital Stay (HOSPITAL_COMMUNITY): Payer: Medicare Other | Admitting: Physician Assistant

## 2019-06-11 ENCOUNTER — Inpatient Hospital Stay (HOSPITAL_COMMUNITY)
Admission: RE | Admit: 2019-06-11 | Discharge: 2019-06-12 | DRG: 483 | Disposition: A | Discharge status: Home / Self Care | Payer: Medicare Other | Attending: Orthopedic Surgery | Admitting: Orthopedic Surgery

## 2019-06-11 ENCOUNTER — Inpatient Hospital Stay (HOSPITAL_COMMUNITY): Payer: Medicare Other

## 2019-06-11 ENCOUNTER — Encounter (HOSPITAL_COMMUNITY)
Admission: RE | Disposition: A | Discharge status: Home / Self Care | Payer: Self-pay | Source: Home / Self Care | Attending: Orthopedic Surgery

## 2019-06-11 ENCOUNTER — Other Ambulatory Visit: Payer: Self-pay

## 2019-06-11 ENCOUNTER — Encounter (HOSPITAL_COMMUNITY): Payer: Self-pay

## 2019-06-11 DIAGNOSIS — E669 Obesity, unspecified: Secondary | ICD-10-CM | POA: Diagnosis present

## 2019-06-11 DIAGNOSIS — Z7984 Long term (current) use of oral hypoglycemic drugs: Secondary | ICD-10-CM | POA: Diagnosis not present

## 2019-06-11 DIAGNOSIS — Z96611 Presence of right artificial shoulder joint: Secondary | ICD-10-CM

## 2019-06-11 DIAGNOSIS — D509 Iron deficiency anemia, unspecified: Secondary | ICD-10-CM | POA: Diagnosis present

## 2019-06-11 DIAGNOSIS — I129 Hypertensive chronic kidney disease with stage 1 through stage 4 chronic kidney disease, or unspecified chronic kidney disease: Secondary | ICD-10-CM | POA: Diagnosis present

## 2019-06-11 DIAGNOSIS — Z79899 Other long term (current) drug therapy: Secondary | ICD-10-CM

## 2019-06-11 DIAGNOSIS — Z96653 Presence of artificial knee joint, bilateral: Secondary | ICD-10-CM | POA: Diagnosis present

## 2019-06-11 DIAGNOSIS — Z7982 Long term (current) use of aspirin: Secondary | ICD-10-CM | POA: Diagnosis not present

## 2019-06-11 DIAGNOSIS — M85811 Other specified disorders of bone density and structure, right shoulder: Secondary | ICD-10-CM | POA: Diagnosis present

## 2019-06-11 DIAGNOSIS — N183 Chronic kidney disease, stage 3 (moderate): Secondary | ICD-10-CM | POA: Diagnosis present

## 2019-06-11 DIAGNOSIS — Z79891 Long term (current) use of opiate analgesic: Secondary | ICD-10-CM

## 2019-06-11 DIAGNOSIS — E1122 Type 2 diabetes mellitus with diabetic chronic kidney disease: Secondary | ICD-10-CM | POA: Diagnosis present

## 2019-06-11 DIAGNOSIS — M12811 Other specific arthropathies, not elsewhere classified, right shoulder: Secondary | ICD-10-CM

## 2019-06-11 DIAGNOSIS — Z9861 Coronary angioplasty status: Secondary | ICD-10-CM | POA: Diagnosis not present

## 2019-06-11 DIAGNOSIS — Z888 Allergy status to other drugs, medicaments and biological substances status: Secondary | ICD-10-CM

## 2019-06-11 DIAGNOSIS — M24011 Loose body in right shoulder: Secondary | ICD-10-CM | POA: Diagnosis present

## 2019-06-11 DIAGNOSIS — E7800 Pure hypercholesterolemia, unspecified: Secondary | ICD-10-CM | POA: Diagnosis present

## 2019-06-11 DIAGNOSIS — Z6838 Body mass index (BMI) 38.0-38.9, adult: Secondary | ICD-10-CM

## 2019-06-11 DIAGNOSIS — Z886 Allergy status to analgesic agent status: Secondary | ICD-10-CM

## 2019-06-11 DIAGNOSIS — Z7902 Long term (current) use of antithrombotics/antiplatelets: Secondary | ICD-10-CM

## 2019-06-11 DIAGNOSIS — M75101 Unspecified rotator cuff tear or rupture of right shoulder, not specified as traumatic: Principal | ICD-10-CM | POA: Diagnosis present

## 2019-06-11 DIAGNOSIS — M19011 Primary osteoarthritis, right shoulder: Secondary | ICD-10-CM | POA: Diagnosis present

## 2019-06-11 DIAGNOSIS — I2510 Atherosclerotic heart disease of native coronary artery without angina pectoris: Secondary | ICD-10-CM | POA: Diagnosis present

## 2019-06-11 DIAGNOSIS — K219 Gastro-esophageal reflux disease without esophagitis: Secondary | ICD-10-CM | POA: Diagnosis present

## 2019-06-11 DIAGNOSIS — Z96619 Presence of unspecified artificial shoulder joint: Secondary | ICD-10-CM

## 2019-06-11 HISTORY — DX: Other specific arthropathies, not elsewhere classified, right shoulder: M12.811

## 2019-06-11 HISTORY — PX: REVERSE SHOULDER ARTHROPLASTY: SHX5054

## 2019-06-11 LAB — GLUCOSE, CAPILLARY
Glucose-Capillary: 151 mg/dL — ABNORMAL HIGH (ref 70–99)
Glucose-Capillary: 133 mg/dL — ABNORMAL HIGH (ref 70–99)

## 2019-06-11 SURGERY — ARTHROPLASTY, SHOULDER, TOTAL, REVERSE
Anesthesia: Regional | Site: Shoulder | Laterality: Right

## 2019-06-11 MED ORDER — CHLORHEXIDINE GLUCONATE 4 % EX LIQD: 60.0000 mL | Freq: Once | CUTANEOUS | Status: DC

## 2019-06-11 MED ORDER — ONDANSETRON HCL 4 MG/2ML IJ SOLN
INTRAMUSCULAR | Status: AC
  Filled 2019-06-11: qty 2

## 2019-06-11 MED ORDER — ACETAMINOPHEN 500 MG PO TABS
1000.0000 mg | ORAL_TABLET | Freq: Once | ORAL | Status: AC
  Administered 2019-06-11: 1000 mg via ORAL
  Filled 2019-06-11: qty 2

## 2019-06-11 MED ORDER — PHENOL 1.4 % MT LIQD
1.0000 | OROMUCOSAL | Status: DC | PRN
  Filled 2019-06-11: qty 177

## 2019-06-11 MED ORDER — MORPHINE SULFATE (PF) 2 MG/ML IV SOLN: 0.5000 mg | INTRAVENOUS | Status: DC | PRN

## 2019-06-11 MED ORDER — TRANEXAMIC ACID-NACL 1000-0.7 MG/100ML-% IV SOLN
1000.0000 mg | Freq: Once | INTRAVENOUS | Status: AC
  Administered 2019-06-11: 1000 mg via INTRAVENOUS
  Filled 2019-06-11: qty 100

## 2019-06-11 MED ORDER — FENTANYL CITRATE (PF) 100 MCG/2ML IJ SOLN
INTRAMUSCULAR | Status: DC | PRN
  Administered 2019-06-11: 50 ug via INTRAVENOUS

## 2019-06-11 MED ORDER — CARVEDILOL 25 MG PO TABS
25.0000 mg | ORAL_TABLET | Freq: Two times a day (BID) | ORAL | Status: DC
  Administered 2019-06-11 – 2019-06-12 (×2): 25 mg via ORAL
  Filled 2019-06-11 (×2): qty 1

## 2019-06-11 MED ORDER — LIDOCAINE 2% (20 MG/ML) 5 ML SYRINGE
INTRAMUSCULAR | Status: AC
  Filled 2019-06-11: qty 5

## 2019-06-11 MED ORDER — PROPOFOL 10 MG/ML IV BOLUS
INTRAVENOUS | Status: DC | PRN
  Administered 2019-06-11: 100 mg via INTRAVENOUS

## 2019-06-11 MED ORDER — CEFAZOLIN SODIUM-DEXTROSE 2-4 GM/100ML-% IV SOLN
2.0000 g | INTRAVENOUS | Status: AC
  Administered 2019-06-11: 2 g via INTRAVENOUS
  Filled 2019-06-11: qty 100

## 2019-06-11 MED ORDER — FENTANYL CITRATE (PF) 100 MCG/2ML IJ SOLN: 25.0000 ug | INTRAMUSCULAR | Status: DC | PRN

## 2019-06-11 MED ORDER — ROCURONIUM BROMIDE 10 MG/ML (PF) SYRINGE
PREFILLED_SYRINGE | INTRAVENOUS | Status: DC | PRN
  Administered 2019-06-11: 70 mg via INTRAVENOUS

## 2019-06-11 MED ORDER — CYANOCOBALAMIN 500 MCG PO TABS
500.0000 ug | ORAL_TABLET | Freq: Every day | ORAL | Status: DC
  Filled 2019-06-11: qty 1

## 2019-06-11 MED ORDER — CEFAZOLIN SODIUM-DEXTROSE 1-4 GM/50ML-% IV SOLN
1.0000 g | Freq: Four times a day (QID) | INTRAVENOUS | Status: AC
  Administered 2019-06-11 – 2019-06-12 (×3): 1 g via INTRAVENOUS
  Filled 2019-06-11 (×3): qty 50

## 2019-06-11 MED ORDER — ZOLPIDEM TARTRATE 5 MG PO TABS: 5.0000 mg | ORAL_TABLET | Freq: Every evening | ORAL | Status: DC | PRN

## 2019-06-11 MED ORDER — STERILE WATER FOR IRRIGATION IR SOLN
Status: DC | PRN
  Administered 2019-06-11: 2000 mL

## 2019-06-11 MED ORDER — ONDANSETRON HCL 4 MG/2ML IJ SOLN
INTRAMUSCULAR | Status: DC | PRN
  Administered 2019-06-11: 4 mg via INTRAVENOUS

## 2019-06-11 MED ORDER — BUPIVACAINE HCL (PF) 0.5 % IJ SOLN
INTRAMUSCULAR | Status: DC | PRN
  Administered 2019-06-11: 15 mL via PERINEURAL

## 2019-06-11 MED ORDER — DOCUSATE SODIUM 100 MG PO CAPS
100.0000 mg | ORAL_CAPSULE | Freq: Two times a day (BID) | ORAL | Status: DC
  Filled 2019-06-11 (×2): qty 1

## 2019-06-11 MED ORDER — POTASSIUM CHLORIDE IN NACL 20-0.45 MEQ/L-% IV SOLN
INTRAVENOUS | Status: DC
  Administered 2019-06-11: 14:00:00 via INTRAVENOUS
  Filled 2019-06-11 (×2): qty 1000

## 2019-06-11 MED ORDER — PHENYLEPHRINE 40 MCG/ML (10ML) SYRINGE FOR IV PUSH (FOR BLOOD PRESSURE SUPPORT)
PREFILLED_SYRINGE | INTRAVENOUS | Status: AC
  Filled 2019-06-11: qty 10

## 2019-06-11 MED ORDER — METOCLOPRAMIDE HCL 5 MG/ML IJ SOLN: 5.0000 mg | Freq: Three times a day (TID) | INTRAMUSCULAR | Status: DC | PRN

## 2019-06-11 MED ORDER — ROCURONIUM BROMIDE 10 MG/ML (PF) SYRINGE
PREFILLED_SYRINGE | INTRAVENOUS | Status: AC
  Filled 2019-06-11: qty 10

## 2019-06-11 MED ORDER — METHOCARBAMOL 500 MG PO TABS: 500.0000 mg | ORAL_TABLET | Freq: Four times a day (QID) | ORAL | Status: DC | PRN

## 2019-06-11 MED ORDER — MAGNESIUM CITRATE PO SOLN: 1.0000 | Freq: Once | ORAL | Status: DC | PRN

## 2019-06-11 MED ORDER — POLYETHYLENE GLYCOL 3350 17 G PO PACK: 17.0000 g | PACK | Freq: Every day | ORAL | Status: DC | PRN

## 2019-06-11 MED ORDER — TORSEMIDE 20 MG PO TABS
20.0000 mg | ORAL_TABLET | Freq: Every day | ORAL | Status: DC
  Administered 2019-06-11 – 2019-06-12 (×2): 20 mg via ORAL
  Filled 2019-06-11 (×2): qty 1

## 2019-06-11 MED ORDER — TRAMADOL HCL 50 MG PO TABS: 50.0000 mg | ORAL_TABLET | Freq: Four times a day (QID) | ORAL | 0 refills | Status: DC | PRN

## 2019-06-11 MED ORDER — 0.9 % SODIUM CHLORIDE (POUR BTL) OPTIME
TOPICAL | Status: DC | PRN
  Administered 2019-06-11: 1000 mL

## 2019-06-11 MED ORDER — BISACODYL 10 MG RE SUPP: 10.0000 mg | Freq: Every day | RECTAL | Status: DC | PRN

## 2019-06-11 MED ORDER — ASPIRIN EC 81 MG PO TBEC
81.0000 mg | DELAYED_RELEASE_TABLET | Freq: Every day | ORAL | Status: DC
  Administered 2019-06-11 – 2019-06-12 (×2): 81 mg via ORAL
  Filled 2019-06-11 (×2): qty 1

## 2019-06-11 MED ORDER — DEXAMETHASONE SODIUM PHOSPHATE 10 MG/ML IJ SOLN
INTRAMUSCULAR | Status: AC
  Filled 2019-06-11: qty 1

## 2019-06-11 MED ORDER — ACETAMINOPHEN 500 MG PO TABS
500.0000 mg | ORAL_TABLET | Freq: Four times a day (QID) | ORAL | Status: AC
  Administered 2019-06-11 – 2019-06-12 (×4): 500 mg via ORAL
  Filled 2019-06-11 (×4): qty 1

## 2019-06-11 MED ORDER — DIPHENHYDRAMINE HCL 12.5 MG/5ML PO ELIX: 12.5000 mg | ORAL_SOLUTION | ORAL | Status: DC | PRN

## 2019-06-11 MED ORDER — LIDOCAINE HCL (CARDIAC) PF 100 MG/5ML IV SOSY
PREFILLED_SYRINGE | INTRAVENOUS | Status: DC | PRN
  Administered 2019-06-11: 100 mg via INTRAVENOUS

## 2019-06-11 MED ORDER — CLOPIDOGREL BISULFATE 75 MG PO TABS
75.0000 mg | ORAL_TABLET | Freq: Every day | ORAL | Status: DC
  Administered 2019-06-12: 75 mg via ORAL
  Filled 2019-06-11: qty 1

## 2019-06-11 MED ORDER — HYDROCODONE-ACETAMINOPHEN 7.5-325 MG PO TABS: 1.0000 | ORAL_TABLET | ORAL | Status: DC | PRN

## 2019-06-11 MED ORDER — PIOGLITAZONE HCL 15 MG PO TABS
15.0000 mg | ORAL_TABLET | Freq: Two times a day (BID) | ORAL | Status: DC
  Administered 2019-06-11 – 2019-06-12 (×3): 15 mg via ORAL
  Filled 2019-06-11 (×4): qty 1

## 2019-06-11 MED ORDER — FOLIC ACID 1 MG PO TABS
1.0000 mg | ORAL_TABLET | Freq: Every day | ORAL | Status: DC
  Administered 2019-06-11 – 2019-06-12 (×2): 1 mg via ORAL
  Filled 2019-06-11 (×2): qty 1

## 2019-06-11 MED ORDER — MAGNESIUM OXIDE 400 MG PO TABS
400.0000 mg | ORAL_TABLET | Freq: Every day | ORAL | Status: DC
  Filled 2019-06-11: qty 1

## 2019-06-11 MED ORDER — ALUM & MAG HYDROXIDE-SIMETH 200-200-20 MG/5ML PO SUSP: 30.0000 mL | ORAL | Status: DC | PRN

## 2019-06-11 MED ORDER — AMLODIPINE BESYLATE 10 MG PO TABS
10.0000 mg | ORAL_TABLET | Freq: Every day | ORAL | Status: DC
  Administered 2019-06-12: 09:00:00 10 mg via ORAL
  Filled 2019-06-11: qty 1

## 2019-06-11 MED ORDER — FAMOTIDINE 20 MG PO TABS
10.0000 mg | ORAL_TABLET | Freq: Two times a day (BID) | ORAL | Status: DC
  Administered 2019-06-11 – 2019-06-12 (×2): 10 mg via ORAL
  Filled 2019-06-11 (×2): qty 1

## 2019-06-11 MED ORDER — SODIUM CHLORIDE 0.9 % IV SOLN
INTRAVENOUS | Status: DC | PRN
  Administered 2019-06-11: 50 ug/min via INTRAVENOUS

## 2019-06-11 MED ORDER — HYDROCODONE-ACETAMINOPHEN 5-325 MG PO TABS: 1.0000 | ORAL_TABLET | ORAL | Status: DC | PRN

## 2019-06-11 MED ORDER — TRANEXAMIC ACID-NACL 1000-0.7 MG/100ML-% IV SOLN
1000.0000 mg | INTRAVENOUS | Status: AC
  Administered 2019-06-11: 08:00:00 1000 mg via INTRAVENOUS
  Filled 2019-06-11: qty 100

## 2019-06-11 MED ORDER — PHENYLEPHRINE HCL (PRESSORS) 10 MG/ML IV SOLN
INTRAVENOUS | Status: DC | PRN
  Administered 2019-06-11 (×3): 80 ug via INTRAVENOUS

## 2019-06-11 MED ORDER — MENTHOL 3 MG MT LOZG: 1.0000 | LOZENGE | OROMUCOSAL | Status: DC | PRN

## 2019-06-11 MED ORDER — METOCLOPRAMIDE HCL 5 MG PO TABS: 5.0000 mg | ORAL_TABLET | Freq: Three times a day (TID) | ORAL | Status: DC | PRN

## 2019-06-11 MED ORDER — ACETAMINOPHEN 325 MG PO TABS: 325.0000 mg | ORAL_TABLET | Freq: Four times a day (QID) | ORAL | Status: DC | PRN

## 2019-06-11 MED ORDER — TRAMADOL HCL 50 MG PO TABS
100.0000 mg | ORAL_TABLET | Freq: Two times a day (BID) | ORAL | Status: DC
  Administered 2019-06-11 – 2019-06-12 (×3): 100 mg via ORAL
  Filled 2019-06-11 (×3): qty 2

## 2019-06-11 MED ORDER — DEXAMETHASONE SODIUM PHOSPHATE 10 MG/ML IJ SOLN
INTRAMUSCULAR | Status: DC | PRN
  Administered 2019-06-11: 10 mg via INTRAVENOUS

## 2019-06-11 MED ORDER — METHOCARBAMOL 500 MG IVPB - SIMPLE MED
500.0000 mg | Freq: Four times a day (QID) | INTRAVENOUS | Status: DC | PRN
  Filled 2019-06-11: qty 50

## 2019-06-11 MED ORDER — ONDANSETRON HCL 4 MG/2ML IJ SOLN: 4.0000 mg | Freq: Four times a day (QID) | INTRAMUSCULAR | Status: DC | PRN

## 2019-06-11 MED ORDER — FENTANYL CITRATE (PF) 100 MCG/2ML IJ SOLN
INTRAMUSCULAR | Status: AC
  Filled 2019-06-11: qty 2

## 2019-06-11 MED ORDER — MAGNESIUM OXIDE 400 (241.3 MG) MG PO TABS
400.0000 mg | ORAL_TABLET | Freq: Every day | ORAL | Status: DC
  Administered 2019-06-11 – 2019-06-12 (×2): 400 mg via ORAL
  Filled 2019-06-11 (×2): qty 1

## 2019-06-11 MED ORDER — VITAMIN B 12 500 MCG PO TABS
500.0000 ug | ORAL_TABLET | Freq: Every day | ORAL | Status: DC
  Filled 2019-06-11: qty 1

## 2019-06-11 MED ORDER — PHENYLEPHRINE HCL (PRESSORS) 10 MG/ML IV SOLN
INTRAVENOUS | Status: AC
  Filled 2019-06-11: qty 1

## 2019-06-11 MED ORDER — ONDANSETRON HCL 4 MG PO TABS: 4.0000 mg | ORAL_TABLET | Freq: Four times a day (QID) | ORAL | Status: DC | PRN

## 2019-06-11 MED ORDER — PROPOFOL 10 MG/ML IV BOLUS
INTRAVENOUS | Status: AC
  Filled 2019-06-11: qty 20

## 2019-06-11 MED ORDER — ATORVASTATIN CALCIUM 40 MG PO TABS
40.0000 mg | ORAL_TABLET | Freq: Every day | ORAL | Status: DC
  Administered 2019-06-12: 40 mg via ORAL
  Filled 2019-06-11: qty 1

## 2019-06-11 MED ORDER — MIDAZOLAM HCL 2 MG/2ML IJ SOLN
INTRAMUSCULAR | Status: AC
  Filled 2019-06-11: qty 2

## 2019-06-11 MED ORDER — CALCIUM CARBONATE 1250 (500 CA) MG PO TABS
1.0000 | ORAL_TABLET | Freq: Two times a day (BID) | ORAL | Status: DC
  Administered 2019-06-11 – 2019-06-12 (×2): 500 mg via ORAL
  Filled 2019-06-11 (×2): qty 1

## 2019-06-11 MED ORDER — LIRAGLUTIDE 18 MG/3ML ~~LOC~~ SOPN: 0.6000 mg | PEN_INJECTOR | Freq: Every day | SUBCUTANEOUS | Status: DC

## 2019-06-11 MED ORDER — CYANOCOBALAMIN 500 MCG PO TABS
500.0000 ug | ORAL_TABLET | Freq: Every day | ORAL | Status: DC
  Administered 2019-06-12: 500 ug via ORAL

## 2019-06-11 MED ORDER — ALLOPURINOL 100 MG PO TABS
100.0000 mg | ORAL_TABLET | Freq: Every day | ORAL | Status: DC
  Administered 2019-06-12: 100 mg via ORAL
  Filled 2019-06-11: qty 1

## 2019-06-11 MED ORDER — OMEPRAZOLE 20 MG PO CPDR: 20.0000 mg | DELAYED_RELEASE_CAPSULE | Freq: Every day | ORAL | Status: DC

## 2019-06-11 MED ORDER — CALCIUM 1200 1200-1000 MG-UNIT PO CHEW: CHEWABLE_TABLET | Freq: Two times a day (BID) | ORAL | Status: DC

## 2019-06-11 MED ORDER — EPHEDRINE SULFATE 50 MG/ML IJ SOLN
INTRAMUSCULAR | Status: DC | PRN
  Administered 2019-06-11: 2.5 mg via INTRAVENOUS

## 2019-06-11 MED ORDER — BENAZEPRIL HCL 10 MG PO TABS
20.0000 mg | ORAL_TABLET | Freq: Every day | ORAL | Status: DC
  Administered 2019-06-11 – 2019-06-12 (×2): 20 mg via ORAL
  Filled 2019-06-11 (×2): qty 2

## 2019-06-11 MED ORDER — LACTATED RINGERS IV SOLN
INTRAVENOUS | Status: DC
  Administered 2019-06-11 (×2): via INTRAVENOUS

## 2019-06-11 MED ORDER — SUGAMMADEX SODIUM 200 MG/2ML IV SOLN
INTRAVENOUS | Status: DC | PRN
  Administered 2019-06-11: 200 mg via INTRAVENOUS

## 2019-06-11 MED ORDER — BUPIVACAINE LIPOSOME 1.3 % IJ SUSP
INTRAMUSCULAR | Status: DC | PRN
  Administered 2019-06-11: 10 mL via PERINEURAL

## 2019-06-11 MED ORDER — ACETAMINOPHEN 500 MG PO TABS: 1000.0000 mg | ORAL_TABLET | Freq: Once | ORAL | Status: DC

## 2019-06-11 MED ORDER — ADULT MULTIVITAMIN W/MINERALS CH
1.0000 | ORAL_TABLET | Freq: Every day | ORAL | Status: DC
  Administered 2019-06-11 – 2019-06-12 (×2): 1 via ORAL
  Filled 2019-06-11 (×2): qty 1

## 2019-06-11 MED ORDER — ONE-DAILY MULTI VITAMINS PO TABS: 1.0000 | ORAL_TABLET | Freq: Every day | ORAL | Status: DC

## 2019-06-11 MED ORDER — EPHEDRINE 5 MG/ML INJ
INTRAVENOUS | Status: AC
  Filled 2019-06-11: qty 10

## 2019-06-11 SURGICAL SUPPLY — 64 items
BAG ZIPLOCK 12X15 (MISCELLANEOUS) ×3 IMPLANT
BASEPLATE AUG MED W-TAPER (Plate) ×2 IMPLANT
BEARING HUMERAL SHLDER 36M STD (Shoulder) IMPLANT
BIT DRILL 2.7 W/STOP DISP (BIT) ×1 IMPLANT
BIT DRILL 2.7MM W/STOP DISP (BIT) ×1
BIT DRILL TWIST 2.7 (BIT) ×1 IMPLANT
BIT DRILL TWIST 2.7MM (BIT) ×1
BLADE SAW SAG 73X25 THK (BLADE) ×2
BLADE SAW SGTL 73X25 THK (BLADE) ×1 IMPLANT
BOOTIES KNEE HIGH SLOAN (MISCELLANEOUS) ×3 IMPLANT
BOWL SMART MIX CTS (DISPOSABLE) IMPLANT
CLOSURE STERI-STRIP 1/2X4 (GAUZE/BANDAGES/DRESSINGS) ×1
CLOSURE WOUND 1/2 X4 (GAUZE/BANDAGES/DRESSINGS) ×1
CLSR STERI-STRIP ANTIMIC 1/2X4 (GAUZE/BANDAGES/DRESSINGS) ×2 IMPLANT
COOLER ICEMAN CLASSIC (MISCELLANEOUS) ×2 IMPLANT
COVER BACK TABLE 60X90IN (DRAPES) ×3 IMPLANT
COVER MAYO STAND STRL (DRAPES) ×3 IMPLANT
COVER SURGICAL LIGHT HANDLE (MISCELLANEOUS) ×3 IMPLANT
COVER WAND RF STERILE (DRAPES) IMPLANT
DECANTER SPIKE VIAL GLASS SM (MISCELLANEOUS) IMPLANT
DRAPE SHEET LG 3/4 BI-LAMINATE (DRAPES) ×6 IMPLANT
DRAPE SURG 17X11 SM STRL (DRAPES) ×3 IMPLANT
DRAPE U-SHAPE 47X51 STRL (DRAPES) ×3 IMPLANT
DRSG MEPILEX BORDER 4X8 (GAUZE/BANDAGES/DRESSINGS) ×3 IMPLANT
DURAPREP 26ML APPLICATOR (WOUND CARE) ×3 IMPLANT
ELECT REM PT RETURN 15FT ADLT (MISCELLANEOUS) ×3 IMPLANT
GLENOID SPHERE STD STRL 36MM (Orthopedic Implant) ×2 IMPLANT
GLOVE BIO SURGEON STRL SZ7.5 (GLOVE) ×3 IMPLANT
GLOVE BIO SURGEON STRL SZ8 (GLOVE) ×3 IMPLANT
GLOVE BIOGEL PI IND STRL 8 (GLOVE) ×2 IMPLANT
GLOVE BIOGEL PI INDICATOR 8 (GLOVE) ×4
GOWN STRL REUS W/TWL 2XL LVL3 (GOWN DISPOSABLE) ×3 IMPLANT
GOWN STRL REUS W/TWL LRG LVL3 (GOWN DISPOSABLE) ×3 IMPLANT
HOOD PEEL AWAY FLYTE STAYCOOL (MISCELLANEOUS) ×6 IMPLANT
KIT BASIN OR (CUSTOM PROCEDURE TRAY) ×3 IMPLANT
KIT TURNOVER KIT A (KITS) IMPLANT
PACK SHOULDER (CUSTOM PROCEDURE TRAY) ×3 IMPLANT
PAD COLD SHLDR WRAP-ON (PAD) ×2 IMPLANT
PIN HUMERAL STMN 3.2MMX9IN (INSTRUMENTS) ×2 IMPLANT
PROTECTOR NERVE ULNAR (MISCELLANEOUS) ×3 IMPLANT
REAMER GUIDE BUSHING SURG DISP (MISCELLANEOUS) ×2 IMPLANT
REAMER GUIDE W/SCREW AUG (MISCELLANEOUS) ×2 IMPLANT
RESTRAINT HEAD UNIVERSAL NS (MISCELLANEOUS) ×3 IMPLANT
SCREW BONE LOCKING 4.75X30X3.5 (Screw) ×2 IMPLANT
SCREW BONE STRL 6.5MMX30MM (Screw) ×2 IMPLANT
SCREW LOCKING 4.75MMX15MM (Screw) ×4 IMPLANT
SCREW LOCKING STRL 4.75X25X3.5 (Screw) ×2 IMPLANT
SHOULDER HUMERAL BEAR 36M STD (Shoulder) ×3 IMPLANT
SLING ARM IMMOBILIZER LRG (SOFTGOODS) ×3 IMPLANT
STEM HUMERAL STRL 9MX55MM (Stem) ×2 IMPLANT
STRIP CLOSURE SKIN 1/2X4 (GAUZE/BANDAGES/DRESSINGS) ×1 IMPLANT
SUCTION FRAZIER HANDLE 12FR (TUBING) ×2
SUCTION TUBE FRAZIER 12FR DISP (TUBING) ×1 IMPLANT
SUPPORT WRAP ARM LG (MISCELLANEOUS) ×3 IMPLANT
SUT FIBERWIRE #2 38 REV NDL BL (SUTURE) ×12
SUT VIC AB 1 CT1 36 (SUTURE) ×3 IMPLANT
SUT VIC AB 2-0 CT1 27 (SUTURE) ×2
SUT VIC AB 2-0 CT1 TAPERPNT 27 (SUTURE) ×1 IMPLANT
SUT VIC AB 3-0 SH 8-18 (SUTURE) ×3 IMPLANT
SUTURE FIBERWR#2 38 REV NDL BL (SUTURE) ×4 IMPLANT
TOWEL OR 17X26 10 PK STRL BLUE (TOWEL DISPOSABLE) ×3 IMPLANT
TOWEL OR NON WOVEN STRL DISP B (DISPOSABLE) ×3 IMPLANT
TOWER CARTRIDGE SMART MIX (DISPOSABLE) IMPLANT
TRAY HUM MINI SHOULDER +0 40D (Shoulder) ×2 IMPLANT

## 2019-06-11 NOTE — Anesthesia Procedure Notes (Signed)
Anesthesia Regional Block: Interscalene brachial plexus block   Pre-Anesthetic Checklist: ,, timeout performed, Correct Patient, Correct Site, Correct Laterality, Correct Procedure, Correct Position, site marked, Risks and benefits discussed,  Surgical consent,  Pre-op evaluation,  At surgeon's request and post-op pain management  Laterality: Right  Prep: Maximum Sterile Barrier Precautions used, chloraprep       Needles:  Injection technique: Single-shot  Needle Type: Echogenic Stimulator Needle     Needle Length: 5cm  Needle Gauge: 22     Additional Needles:   Procedures:,,,, ultrasound used (permanent image in chart),,,,  Narrative:  Start time: 06/11/2019 7:08 AM End time: 06/11/2019 7:18 AM Injection made incrementally with aspirations every 5 mL.  Performed by: Personally  Anesthesiologist: Freddrick March, MD  Additional Notes: Monitors applied. No increased pain on injection. No increased resistance to injection. Injection made in 5cc increments. Good needle visualization. Patient tolerated procedure well.

## 2019-06-11 NOTE — Anesthesia Procedure Notes (Signed)
Procedure Name: Intubation Date/Time: 06/11/2019 7:40 AM Performed by: Glory Buff, CRNA Pre-anesthesia Checklist: Patient identified, Emergency Drugs available, Suction available and Patient being monitored Patient Re-evaluated:Patient Re-evaluated prior to induction Oxygen Delivery Method: Circle system utilized Preoxygenation: Pre-oxygenation with 100% oxygen Induction Type: IV induction Ventilation: Mask ventilation without difficulty Laryngoscope Size: Glidescope and 3 Grade View: Grade I Tube type: Oral Tube size: 7.0 mm Number of attempts: 1 Airway Equipment and Method: Stylet,  Oral airway and Video-laryngoscopy Placement Confirmation: ETT inserted through vocal cords under direct vision,  positive ETCO2 and breath sounds checked- equal and bilateral Secured at: 20 cm Tube secured with: Tape Dental Injury: Teeth and Oropharynx as per pre-operative assessment  Difficulty Due To: Difficulty was anticipated, Difficult Airway- due to reduced neck mobility and Difficult Airway- due to limited oral opening

## 2019-06-11 NOTE — Care Plan (Signed)
Ortho Bundle Case Management Note  Patient Details  Name: Kimberly Baldwin MRN: [id number] Date of Birth: Nov 07, 1946    Spoke with patient prior to surgery. She plans to discharge to home with family to assist. Her post op rehab will be determined at the office at the appropriate time. She has no HH or DME needs.                  DME Arranged:    DME Agency:     HH Arranged:    HH Agency:     Additional Comments: Please contact me with any questions of if this plan should need to change.  Ladell Heads,  [place] [facility]  [phone] 06/11/2019, 8:49 AM

## 2019-06-11 NOTE — H&P (Signed)
PREOPERATIVE H&P  Chief Complaint: right shoulder pain  HPI: Kimberly Baldwin is a 73 y.o. female who presents for preoperative history and physical with a diagnosis of right RCA. Symptoms are rated as moderate to severe, and have been worsening.  This is significantly impairing activities of daily living.  She has elected for surgical management.   She has failed injections, activity modification, anti-inflammatories, and assistive devices.  Preoperative X-rays demonstrate end stage degenerative changes with osteophyte formation, loss of joint space, subchondral sclerosis.   Past Medical History:  Diagnosis Date  . Aortic valve stenosis   . Arthritis   . CAD (coronary artery disease)   . CRF (chronic renal failure), stage 3 (moderate) (HCC)   . Diabetes (HCC)   . GERD (gastroesophageal reflux disease)   . Guaiac positive stools 07/25/2018  . Headache   . High cholesterol   . History of hiatal hernia   . Hypertension   . IDA (iron deficiency anemia)    Past Surgical History:  Procedure Laterality Date  . Cataract surgery    . CHOLECYSTECTOMY    . COLONOSCOPY N/A 09/27/2018   Procedure: COLONOSCOPY;  Surgeon: Malissa Hippoehman, Najeeb U, MD;  [facility];  Service: Endoscopy;  Laterality: N/A;  2:00  . complete hyste    . CORONARY ANGIOPLASTY    . REPLACEMENT TOTAL KNEE     both knee, 2010, 2015   Social History   Socioeconomic History  . Marital status: Widowed    Spouse name: Not on file  . Number of children: Not on file  . Years of education: Not on file  . Highest education level: Not on file  Occupational History  . Not on file  Social Needs  . Financial resource strain: Not hard at all  . Food insecurity    Worry: Patient refused    Inability: Patient refused  . Transportation needs    Medical: Patient refused    Non-medical: Patient refused  Tobacco Use  . Smoking status: Never Smoker  . Smokeless tobacco: Never Used  Substance and Sexual Activity   . Alcohol use: Never    Frequency: Never  . Drug use: Never  . Sexual activity: Not on file  Lifestyle  . Physical activity    Days per week: Patient refused    Minutes per session: Patient refused  . Stress: Not at all  Relationships  . Social Musicianconnections    Talks on phone: Once a week    Gets together: Once a week    Attends religious service: 1 to 4 times per year    Active member of club or organization: Patient refused    Attends meetings of clubs or organizations: Never    Relationship status: Widowed  Other Topics Concern  . Not on file  Social History Narrative  . Not on file   History reviewed. No pertinent family history. Allergies  Allergen Reactions  . Diclofenac     nausea  . Mobic [Meloxicam]     nauseated   Prior to Admission medications   Medication Sig Start Date End Date Taking? Authorizing Provider  acetaminophen (TYLENOL) 500 MG tablet Take 1,000 mg by mouth 2 (two) times daily before a meal.   Yes [provider]  allopurinol (ZYLOPRIM) 100 MG tablet Take 100 mg by mouth daily.    Yes [provider]  amLODipine (NORVASC) 10 MG tablet Take 10 mg by mouth daily.   Yes [provider]  aspirin EC 81  MG tablet Take 81 mg by mouth daily.   Yes [provider]  atorvastatin (LIPITOR) 40 MG tablet Take 40 mg by mouth daily.   Yes [provider]  benazepril (LOTENSIN) 20 MG tablet Take 20 mg by mouth daily.   Yes [provider]  Calcium Carbonate-Vit D-Min (CALCIUM 1200 PO) Take 600 mg by mouth 2 (two) times daily.    Yes [provider]  carvedilol (COREG) 25 MG tablet Take 25 mg by mouth 2 (two) times daily with a meal.   Yes [provider]  clopidogrel (PLAVIX) 75 MG tablet Take 75 mg by mouth daily.   Yes [provider]  famotidine (PEPCID) 10 MG tablet Take 10 mg by mouth 2 (two) times daily.   Yes [provider]  fluticasone (VERAMYST) 27.5 MCG/SPRAY nasal  spray Place 2 sprays into the nose as needed for rhinitis.   Yes [provider]  folic acid (FOLVITE) 1 MG tablet Take 1 mg by mouth daily.   Yes [provider]  GARLIC PO Take 1 mg by mouth daily.   Yes [provider]  liraglutide (VICTOZA) 18 MG/3ML SOPN Inject 0.6 mg into the skin daily.   Yes [provider]  magnesium oxide (MAG-OX) 400 MG tablet Take 400 mg by mouth daily.   Yes [provider]  Multiple Vitamin (MULTIVITAMIN) tablet Take 1 tablet by mouth daily.   Yes [provider]  Omega-3 Fatty Acids (FISH OIL) 1000 MG CAPS Take 2 capsules by mouth 2 (two) times daily.    Yes [provider]  pioglitazone (ACTOS) 30 MG tablet Take 15 mg by mouth 2 (two) times daily.    Yes [provider]  torsemide (DEMADEX) 20 MG tablet Take 20 mg by mouth daily.   Yes [provider]  traMADol (ULTRAM) 50 MG tablet Take 50 mg by mouth at bedtime.    Yes [provider]  vitamin B-12 (CYANOCOBALAMIN) 500 MCG tablet Take 500 mcg by mouth daily.    Yes [provider]  azithromycin (ZITHROMAX) 250 MG tablet Take by mouth as needed. Dental procedures.    [provider]     Positive ROS: All other systems have been reviewed and were otherwise negative with the exception of those mentioned in the HPI and as above.  Physical Exam: General: Alert, no acute distress Cardiovascular: No pedal edema Respiratory: No cyanosis, no use of accessory musculature GI: No organomegaly, abdomen is soft and non-tender Skin: No lesions in the area of chief complaint Neurologic: Sensation intact distally Psychiatric: Patient is competent for consent with normal mood and affect Lymphatic: No axillary or cervical lymphadenopathy  MUSCULOSKELETAL: right shoulder AROM 0-40 with weakness and crepitance  Assessment: Right rotator cuff arthropathy   Plan: Plan for Procedure(s): REVERSE SHOULDER  ARTHROPLASTY  The risks benefits and alternatives were discussed with the patient including but not limited to the risks of nonoperative treatment, versus surgical intervention including infection, bleeding, nerve injury,  blood clots, cardiopulmonary complications, morbidity, mortality, among others, and they were willing to proceed.   Anticipated LOS equal to or greater than 2 midnights due to - Age 68 and older with one or more of the following:  - Obesity  - Expected need for hospital services (PT, OT, Nursing) required for safe  discharge  - Anticipated need for postoperative skilled nursing care or inpatient rehab  - Active co-morbidities: None     Johnny Bridge, MD Cell [phone]  [phone]   06/11/2019 7:24 AM

## 2019-06-11 NOTE — Transfer of Care (Signed)
Immediate Anesthesia Transfer of Care Note  Patient: Charlotta Newton  Procedure(s) Performed: REVERSE SHOULDER ARTHROPLASTY (Right Shoulder)  Patient Location: PACU  Anesthesia Type:General and Regional  Level of Consciousness: drowsy, patient cooperative and responds to stimulation  Airway & Oxygen Therapy: Patient Spontanous Breathing and Patient connected to face mask oxygen  Post-op Assessment: Report given to RN and Post -op Vital signs reviewed and stable  Post vital signs: Reviewed and stable  Last Vitals:  Vitals Value Taken Time  BP 140/59 06/11/19 1000  Temp    Pulse 63 06/11/19 1001  Resp 18 06/11/19 1001  SpO2 100 % 06/11/19 1001  Vitals shown include unvalidated device data.  Last Pain:  Vitals:   06/11/19 0601  TempSrc: Oral  PainSc:       Patients Stated Pain Goal: 5 (09/38/18 2993)  Complications: No apparent anesthesia complications

## 2019-06-11 NOTE — Discharge Instructions (Signed)
Diet: As you were doing prior to hospitalization   Shower:  May shower but keep the wounds dry, use an occlusive plastic wrap, NO SOAKING IN TUB.  If the bandage gets wet, change with a clean dry gauze.  If you have a splint on, leave the splint in place and keep the splint dry with a plastic bag.  Dressing:  You may change your dressing 3-5 days after surgery, unless you have a splint.  If you have a splint, then just leave the splint in place and we will change your bandages during your first follow-up appointment.    If you had hand or foot surgery, we will plan to remove your stitches in about 2 weeks in the office.  For all other surgeries, there are sticky tapes (steri-strips) on your wounds and all the stitches are absorbable.  Leave the steri-strips in place when changing your dressings, they will peel off with time, usually 2-3 weeks.  Activity:  Increase activity slowly as tolerated, but follow the weight bearing instructions below.  The rules on driving is that you can not be taking narcotics while you drive, and you must feel in control of the vehicle.    Weight Bearing:   Sling except for hygiene and pendulum swings.    To prevent constipation: you may use a stool softener such as -  Colace (over the counter) 100 mg by mouth twice a day  Drink plenty of fluids (prune juice may be helpful) and high fiber foods Miralax (over the counter) for constipation as needed.    Itching:  If you experience itching with your medications, try taking only a single pain pill, or even half a pain pill at a time.  You may take up to 10 pain pills per day, and you can also use benadryl over the counter for itching or also to help with sleep.   Precautions:  If you experience chest pain or shortness of breath - call 911 immediately for transfer to the hospital emergency department!!  If you develop a fever greater that 101 F, purulent drainage from wound, increased redness or drainage from wound, or  calf pain -- Call the office at [phone]                                                Follow- Up Appointment:  Please call for an appointment to be seen in 2 weeks [place] - [phone]

## 2019-06-11 NOTE — Anesthesia Postprocedure Evaluation (Signed)
Anesthesia Post Note  Patient: Kimberly Baldwin  Procedure(s) Performed: REVERSE SHOULDER ARTHROPLASTY (Right Shoulder)     Patient location during evaluation: PACU Anesthesia Type: Regional and General Level of consciousness: awake and alert Pain management: pain level controlled Vital Signs Assessment: post-procedure vital signs reviewed and stable Respiratory status: spontaneous breathing, nonlabored ventilation, respiratory function stable and patient connected to nasal cannula oxygen Cardiovascular status: blood pressure returned to baseline and stable Postop Assessment: no apparent nausea or vomiting Anesthetic complications: no    Last Vitals:  Vitals:   06/11/19 1151 06/11/19 1251  BP: 136/68 128/75  Pulse: 62 (!) 59  Resp: 14 14  Temp: 36.5 C 36.6 C  SpO2: 95% 98%    Last Pain:  Vitals:   06/11/19 1251  TempSrc: Oral  PainSc:                  Thatiana Renbarger L Laydon Martis

## 2019-06-11 NOTE — Op Note (Signed)
06/11/2019  9:55 AM  PATIENT:  Kimberly Baldwin    PRE-OPERATIVE DIAGNOSIS: Right rotator cuff arthropathy  POST-OPERATIVE DIAGNOSIS:  Same  PROCEDURE: RIGHT reverse Total Shoulder Arthroplasty  SURGEON:  Johnny Bridge, MD  PHYSICIAN ASSISTANT: Joya Gaskins, OPA-C, present and scrubbed throughout the case, critical for completion in a timely fashion, and for retraction, instrumentation, and closure.  ANESTHESIA:   General with Exparel interscalene block  ESTIMATED BLOOD LOSS: 125 mL  UNIQUE ASPECTS OF THE CASE: The cephalic vein was sacrificed.  The biceps tendon was not present.  Subscapularis tendon was not present.  There is extreme scarring of the subacromial space, extremely difficult to get and superiorly.  Infraspinatus was also not present.  Supraspinatus not present.  There was significant superior wear on the glenoid such that an augmented baseplate restored the inclination with minimizing bone loss inferiorly.  I had at least 85% circumferential contact of the baseplate, the posterior hole was probably off of the bone by about a minimum millimeter, however I would have had to have adjusted the version, or alternatively taken more bone, neither of which were good solutions, and I had excellent bony support.  I ended up cutting the humerus twice, because access to the glenoid was challenging, ultimately I was flush with the bone posteriorly with the baseplate, a little gap anteriorly, the soft tissue tension felt very reasonable, although it was not as tight as I anticipated, but felt stable with shuck and rotation.  PREOPERATIVE INDICATIONS:  Kimberly Baldwin is a  73 y.o. female with a diagnosis of right shoulder rotator cuff arthropathy who failed conservative measures and elected for surgical management.    The risks benefits and alternatives were discussed with the patient preoperatively including but not limited to the risks of infection, bleeding, nerve injury,  cardiopulmonary complications, the need for revision surgery, dislocation, brachial plexus palsy, incomplete relief of pain, among others, and the patient was willing to proceed.  OPERATIVE IMPLANTS: Biomet size 9 humeral stem press-fit standard with a 44 mm reverse shoulder arthroplasty tray with a standard liner and a 36 mm  glenosphere set on B with a superiorly augmented baseplate baseplate and 4 locking screws and one central nonlocking screw.  OPERATIVE FINDINGS: Advanced glenohumeral rotator cuff arthropathy with superior glenoid bone loss and extensive osteophyte formation, loose body found in the axillary recess.  Biceps tendon and infraspinatus supraspinatus and subscapularis tendons were not present.  OPERATIVE PROCEDURE: The patient was brought to the operating room and placed in the supine position. General anesthesia was administered. IV antibiotics were given.  Time out was performed. The upper extremity was prepped and draped in usual sterile fashion. The patient was in a beachchair position. Deltopectoral approach was carried out.   There was no biceps to tenodesis, and I released the anterior capsule off the bone, really no significant subscapularis to speak of.  I then performed circumferential releases of the humerus, and then dislocated the head, and then reamed with the reamer to the above named size.  I then applied the jig, and cut the humeral head in 30 of retroversion, and then turned my attention to the glenoid.  Deep retractors were placed, and I resected the labrum, although there was not much of it left,, and then placed a guidepin into the center position on the glenoid, with slight inferior inclination.  Initially it was too tight to get the right line, so I went back and took more bone off of the humerus.  I then reamed over the guidepin, and this created a small metaphyseal cancellus blush inferiorly.  I measured the superior bone loss, it appeared to be a medium,  I placed the central guide for the inferior engagement hole, used the appropriate bushing to ream the superior face just slightly.  Once I had prepared for the augment, I removed the inferior screw, placed the real implant, and secured it centrally with a nonlocking screw, 30 mm.   Locking screws were then used circumferentially.  I had excellent purchase both inferiorly and superiorly. I placed a short locking screws on anterior and posterior aspects.  I then turned my attention to the glenosphere, and impacted this into place, placing slight inferior offset (set on B).   The glenoid sphere was completely seated, and had engagement of the [facility]. I then turned my attention back to the humerus.  I sequentially broached, and then trialed, and was found to restore soft tissue tension, and it had 2 finger tightness. Therefore the above named components were selected. The shoulder felt stable throughout functional motion.  I then impacted the real prosthesis into place, as well as the real humeral tray, and reduced the shoulder. The shoulder had excellent motion, and was stable, and I irrigated the wounds copiously.  The cephalic vein appeared to have been damaged by the occluded retractor during exposure of the glenoid, and I ligated the vein using a suture superiorly and a Bovie inferiorly.  I then irrigated the shoulder copiously once more, repaired the deltopectoral interval with Vicryl followed by subcutaneous Vicryl with Steri-Strips and sterile gauze for the skin. The patient was awakened and returned back in stable and satisfactory condition. There no complications and She tolerated the procedure well.

## 2019-06-12 ENCOUNTER — Encounter (HOSPITAL_COMMUNITY): Payer: Self-pay | Admitting: Orthopedic Surgery

## 2019-06-12 LAB — BASIC METABOLIC PANEL
Anion gap: 8 (ref 5–15)
BUN: 28 mg/dL — ABNORMAL HIGH (ref 8–23)
CO2: 22 mmol/L (ref 22–32)
Calcium: 8.6 mg/dL — ABNORMAL LOW (ref 8.9–10.3)
Chloride: 105 mmol/L (ref 98–111)
Creatinine, Ser: 1.56 mg/dL — ABNORMAL HIGH (ref 0.44–1.00)
GFR calc Af Amer: 37 mL/min — ABNORMAL LOW (ref >60)
GFR calc non Af Amer: 32 mL/min — ABNORMAL LOW (ref >60)
Glucose, Bld: 207 mg/dL — ABNORMAL HIGH (ref 70–99)
Potassium: 4.5 mmol/L (ref 3.5–5.1)
Sodium: 135 mmol/L (ref 135–145)

## 2019-06-12 LAB — CBC
HCT: 34 % — ABNORMAL LOW (ref 36.0–46.0)
Hemoglobin: 10.6 g/dL — ABNORMAL LOW (ref 12.0–15.0)
MCH: 32.7 pg (ref 26.0–34.0)
MCHC: 31.2 g/dL (ref 30.0–36.0)
MCV: 104.9 fL — ABNORMAL HIGH (ref 80.0–100.0)
Platelets: 175 10*3/uL (ref 150–400)
RBC: 3.24 MIL/uL — ABNORMAL LOW (ref 3.87–5.11)
RDW: 13.5 % (ref 11.5–15.5)
WBC: 13.4 10*3/uL — ABNORMAL HIGH (ref 4.0–10.5)
nRBC: 0 % (ref 0.0–0.2)

## 2019-06-12 MED ORDER — ONDANSETRON HCL 4 MG PO TABS: 4.0000 mg | ORAL_TABLET | Freq: Three times a day (TID) | ORAL | 0 refills | Status: DC | PRN

## 2019-06-12 MED ORDER — HYDROCODONE-ACETAMINOPHEN 10-325 MG PO TABS: 1.0000 | ORAL_TABLET | Freq: Four times a day (QID) | ORAL | 0 refills | Status: DC | PRN

## 2019-06-12 MED ORDER — SENNA-DOCUSATE SODIUM 8.6-50 MG PO TABS: 2.0000 | ORAL_TABLET | Freq: Every day | ORAL | 1 refills | Status: DC

## 2019-06-12 NOTE — Evaluation (Signed)
Occupational Therapy Evaluation Patient Details Name: Kimberly Baldwin MRN: [id number] DOB: 1945/12/20 Today's Date: 06/12/2019    History of Present Illness R  reverse total shoulder arthroplasty   Clinical Impression   OT education complete. Handout provided.  Pt verbalized understanding and is able to verbalize how for caregivers to A at home    Follow Up Recommendations  Follow surgeon's recommendation for DC plan and follow-up therapies    Equipment Recommendations  None recommended by OT       Precautions / Restrictions Precautions Precautions: Shoulder Shoulder Interventions: At all times;Shoulder sling/immobilizer Restrictions Weight Bearing Restrictions: Yes RUE Weight Bearing: Non weight bearing      Mobility Bed Mobility               General bed mobility comments: Pt OOB  Transfers Overall transfer level: Needs assistance Equipment used: 1 person hand held assist Transfers: Sit to/from Stand;Stand Pivot Transfers Sit to Stand: Supervision Stand pivot transfers: Supervision            Balance Overall balance assessment: No apparent balance deficits (not formally assessed)                                         ADL either performed or assessed with clinical judgement                      Pertinent Vitals/Pain Pain Assessment: 0-10 Pain Score: 3  Pain Location: R shoulder Pain Descriptors / Indicators: Discomfort Pain Intervention(s): Monitored during session     Hand Dominance     Extremity/Trunk Assessment Upper Extremity Assessment Upper Extremity Assessment: RUE deficits/detail RUE Deficits / Details: s/p shoulder surgery.           Communication Communication Communication: No difficulties   Cognition Arousal/Alertness: Awake/alert Behavior During Therapy: WFL for tasks assessed/performed Overall Cognitive Status: Within Functional Limits for tasks assessed                                            Shoulder Instructions Shoulder Instructions Donning/doffing shirt without moving shoulder: Minimal assistance;Patient able to independently direct caregiver Method for sponge bathing under operated UE: Minimal assistance;Patient able to independently direct caregiver Donning/doffing sling/immobilizer: Minimal assistance;Patient able to independently direct caregiver Correct positioning of sling/immobilizer: Minimal assistance;Patient able to independently direct caregiver ROM for elbow, wrist and digits of operated UE: Minimal assistance;Patient able to independently direct caregiver Sling wearing schedule (on at all times/off for ADL's): Supervision/safety;Patient able to independently direct caregiver Proper positioning of operated UE when showering: Minimal assistance;Patient able to independently direct caregiver    Home Living Family/patient expects to be discharged to:: Private residence Living Arrangements: Non-relatives/Friends Available Help at Discharge: Family                                     OT Goals(Current goals can be found in the care plan section) Acute Rehab OT Goals Patient Stated Goal: home today OT Goal Formulation: With patient Potential to Achieve Goals: Good  OT Frequency:     Barriers to D/C:               AM-PAC OT "6 Clicks" Daily Activity  Outcome Measure Help from another person eating meals?: None Help from another person taking care of personal grooming?: A Little Help from another person toileting, which includes using toliet, bedpan, or urinal?: A Little Help from another person bathing (including washing, rinsing, drying)?: A Little Help from another person to put on and taking off regular upper body clothing?: A Little Help from another person to put on and taking off regular lower body clothing?: A Little 6 Click Score: 19   End of Session Equipment Utilized During Treatment: Other  (comment)(sling)  Activity Tolerance: Patient tolerated treatment well Patient left: in chair;with call bell/phone within reach                   Time: 1100-1124 OT Time Calculation (min): 24 min Charges:  OT General Charges $OT Visit: 1 Visit OT Evaluation $OT Eval Low Complexity: 1 Low $OT Eval Moderate Complexity: 1 Mod  Lise AuerLori Kailah Pennel, OT Acute Rehabilitation Services [phone]- [phone] Office- [phone]     [name], [name] 06/12/2019, 12:48 PM

## 2019-06-12 NOTE — Plan of Care (Signed)
Plan of care reviewed and discussed with the patient. 

## 2019-06-12 NOTE — Discharge Summary (Signed)
Physician Discharge Summary  Patient ID: Kimberly Baldwin MRN: [id number] DOB/AGE: 1946-06-29 73 y.o.  Admit date: 06/11/2019 Discharge date: 06/12/2019  Admission Diagnoses:  Right rotator cuff tear arthropathy  Discharge Diagnoses:  Principal Problem:   Right rotator cuff tear arthropathy Active Problems:   S/P reverse total shoulder arthroplasty, right   Past Medical History:  Diagnosis Date  . Aortic valve stenosis   . Arthritis   . CAD (coronary artery disease)   . CRF (chronic renal failure), stage 3 (moderate) (HCC)   . Diabetes ([place])   . GERD (gastroesophageal reflux disease)   . Guaiac positive stools 07/25/2018  . Headache   . High cholesterol   . History of hiatal hernia   . Hypertension   . IDA (iron deficiency anemia)   . Right rotator cuff tear arthropathy 06/11/2019    Surgeries: Procedure(s): REVERSE SHOULDER ARTHROPLASTY on 06/11/2019   Consultants (if any):   Discharged Condition: Improved  Hospital Course: Kimberly Baldwin is an 73 y.o. female who was admitted 06/11/2019 with a diagnosis of Right rotator cuff tear arthropathy and went to the operating room on 06/11/2019 and underwent the above named procedures.    She was given perioperative antibiotics:  Anti-infectives (From admission, onward)   Start     Dose/Rate Route Frequency Ordered Stop   06/11/19 1400  ceFAZolin (ANCEF) IVPB 1 g/50 mL premix     1 g 100 mL/hr over 30 Minutes Intravenous Every 6 hours 06/11/19 1211 06/12/19 0246   06/11/19 0600  ceFAZolin (ANCEF) IVPB 2g/100 mL premix     2 g 200 mL/hr over 30 Minutes Intravenous On call to O.R. 06/11/19 7741 06/11/19 2878    .  She was given sequential compression devices, early ambulation for DVT prophylaxis.  She benefited maximally from the hospital stay and there were no complications.    Recent vital signs:  Vitals:   06/12/19 0003 06/12/19 0628  BP: (!) 142/57 (!) 145/66  Pulse: 62 69  Resp: 16 18  Temp: 98.3 F (36.8 C)  97.6 F (36.4 C)  SpO2: 96% 95%    Recent laboratory studies:  Lab Results  Component Value Date   HGB 10.6 (L) 06/12/2019   HGB 12.7 06/03/2019   Lab Results  Component Value Date   WBC 13.4 (H) 06/12/2019   PLT 175 06/12/2019   No results found for: INR Lab Results  Component Value Date   NA 135 06/12/2019   K 4.5 06/12/2019   CL 105 06/12/2019   CO2 22 06/12/2019   BUN 28 (H) 06/12/2019   CREATININE 1.56 (H) 06/12/2019   GLUCOSE 207 (H) 06/12/2019    Discharge Medications:   Allergies as of 06/12/2019      Reactions   Diclofenac    nausea   Mobic [meloxicam]    nauseated      Medication List    TAKE these medications   acetaminophen 500 MG tablet Commonly known as: TYLENOL Take 1,000 mg by mouth 2 (two) times daily before a meal.   allopurinol 100 MG tablet Commonly known as: ZYLOPRIM Take 100 mg by mouth daily.   amLODipine 10 MG tablet Commonly known as: NORVASC Take 10 mg by mouth daily.   aspirin EC 81 MG tablet Take 81 mg by mouth daily.   atorvastatin 40 MG tablet Commonly known as: LIPITOR Take 40 mg by mouth daily.   benazepril 20 MG tablet Commonly known as: LOTENSIN Take 20 mg by mouth daily.  CALCIUM 1200 PO Take 600 mg by mouth 2 (two) times daily.   carvedilol 25 MG tablet Commonly known as: COREG Take 25 mg by mouth 2 (two) times daily with a meal.   clopidogrel 75 MG tablet Commonly known as: PLAVIX Take 75 mg by mouth daily.   famotidine 10 MG tablet Commonly known as: PEPCID Take 10 mg by mouth 2 (two) times daily.   Fish Oil 1000 MG Caps Take 2 capsules by mouth 2 (two) times daily.   fluticasone 27.5 MCG/SPRAY nasal spray Commonly known as: VERAMYST Place 2 sprays into the nose as needed for rhinitis.   folic acid 1 MG tablet Commonly known as: FOLVITE Take 1 mg by mouth daily.   GARLIC PO Take 1 mg by mouth daily.   HYDROcodone-acetaminophen 10-325 MG tablet Commonly known as: Norco Take 1 tablet by  mouth every 6 (six) hours as needed.   liraglutide 18 MG/3ML Sopn Commonly known as: VICTOZA Inject 0.6 mg into the skin daily.   magnesium oxide 400 MG tablet Commonly known as: MAG-OX Take 400 mg by mouth daily.   multivitamin tablet Take 1 tablet by mouth daily.   ondansetron 4 MG tablet Commonly known as: Zofran Take 1 tablet (4 mg total) by mouth every 8 (eight) hours as needed for nausea or vomiting.   pioglitazone 30 MG tablet Commonly known as: ACTOS Take 15 mg by mouth 2 (two) times daily.   sennosides-docusate sodium 8.6-50 MG tablet Commonly known as: SENOKOT-S Take 2 tablets by mouth daily.   torsemide 20 MG tablet Commonly known as: DEMADEX Take 20 mg by mouth daily.   traMADol 50 MG tablet Commonly known as: ULTRAM Take 1 tablet (50 mg total) by mouth every 6 (six) hours as needed. What changed:   when to take this  reasons to take this   vitamin B-12 500 MCG tablet Commonly known as: CYANOCOBALAMIN Take 500 mcg by mouth daily.   Zithromax 250 MG tablet Generic drug: azithromycin Take by mouth as needed. Dental procedures.       Diagnostic Studies: Dg Shoulder Right Port  Result Date: 06/11/2019 CLINICAL DATA:  Right shoulder replacement EXAM: PORTABLE RIGHT SHOULDER COMPARISON:  None. FINDINGS: Interval right total reverse shoulder arthroplasty. Normal alignment. No hardware failure or complication. No acute fracture or dislocation. IMPRESSION: Interval right total reverse shoulder arthroplasty. Electronically Signed   By: Elige KoHetal  Patel   On: 06/11/2019 13:27    Disposition:     Follow-up Information    Teryl LucyLandau, Dontray Haberland, MD. Go on 06/24/2019.   Specialty: Orthopedic Surgery Why: Your appointment is scheduled for 11:00.  Contact information: [address] [place] [address] [phone]            Signed: Eulas PostJoshua P Danae Oland 06/12/2019, 8:15 AM

## 2019-06-12 NOTE — Progress Notes (Signed)
The pt was provided with d/c instructions. After discussing the pt's plan of care upon d/c home, the pt reported no further questions or concerns. Pt is currently waiting for her ride to arrive.

## 2019-06-12 NOTE — Progress Notes (Signed)
Doing fine, wants to go home.   Johnny Bridge, MD

## 2019-07-29 IMAGING — CT CT SHOULDER*R* W/O CM
1 series · 12 of 14 positions shown, 15 images · non-contrast
Comparison: None.

CLINICAL DATA: Right shoulder pain for several months. Getting
worse for 2 months.

EXAM:
CT OF THE UPPER RIGHT EXTREMITY WITHOUT CONTRAST
TECHNIQUE: Multidetector CT imaging of the upper right extremity was performed
according to the standard protocol.

[Series 2: soft tissue · axial · 0.52mm/px · z∈[-246,-76]mm · 12 of 101 slices shown, 15 images]
[im 8/101  soft-tissue]
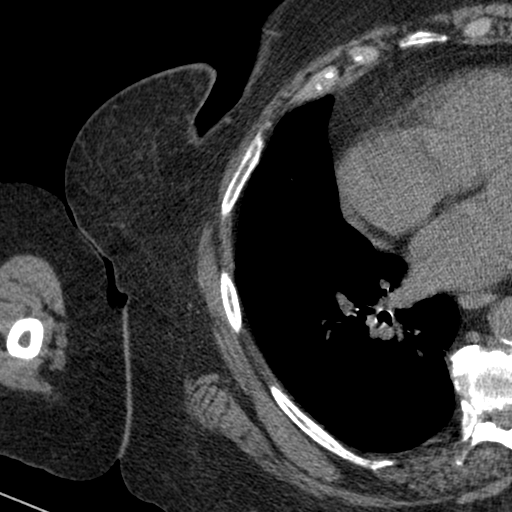
[im 8/101  bone]
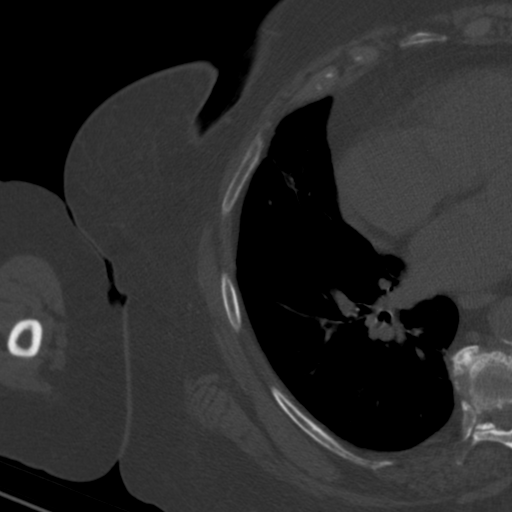
[im 16/101  bone]
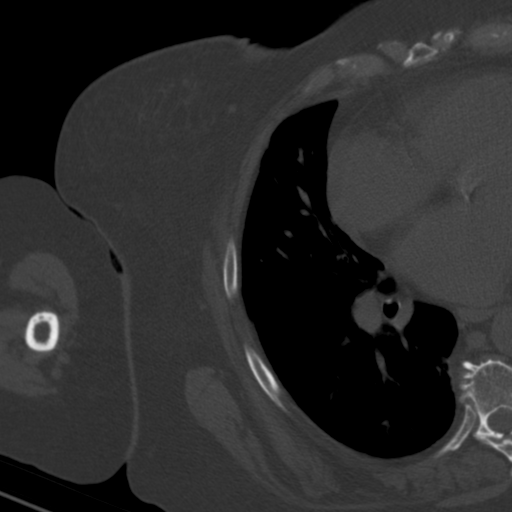
[im 24/101  bone]
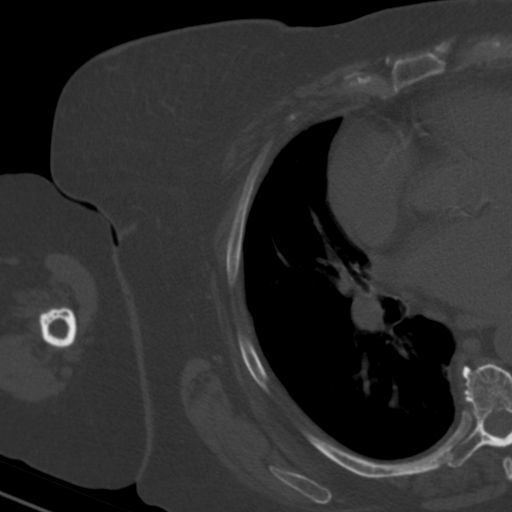
[im 31/101  bone]
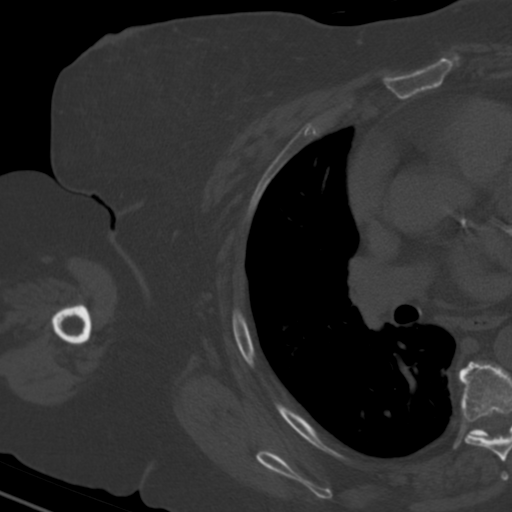
[im 39/101  soft-tissue]
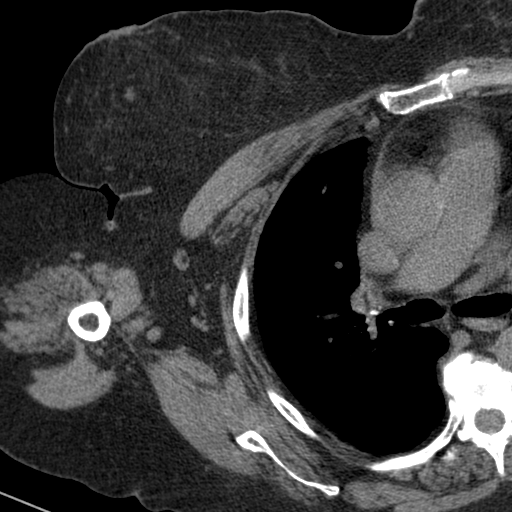
[im 39/101  bone]
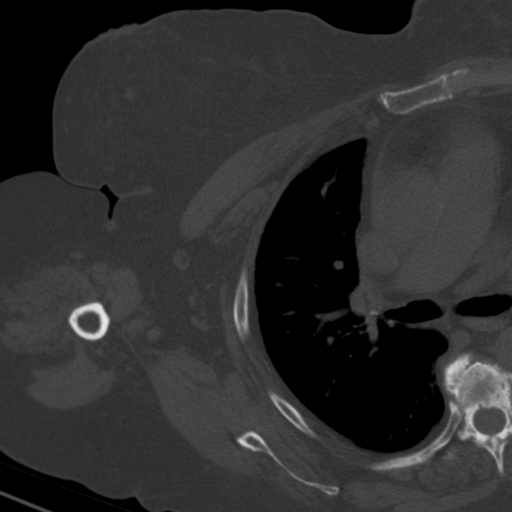
[im 47/101  bone]
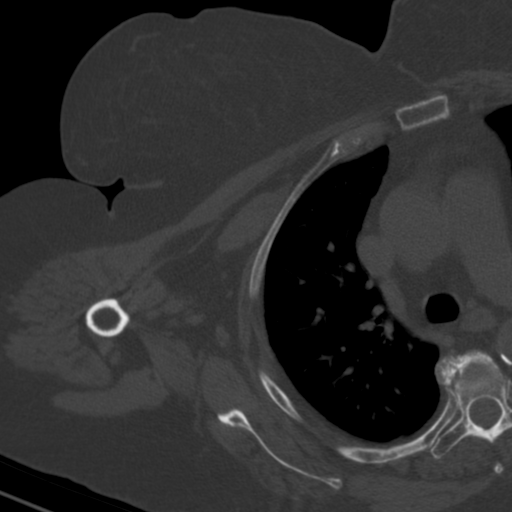
[im 54/101  bone]
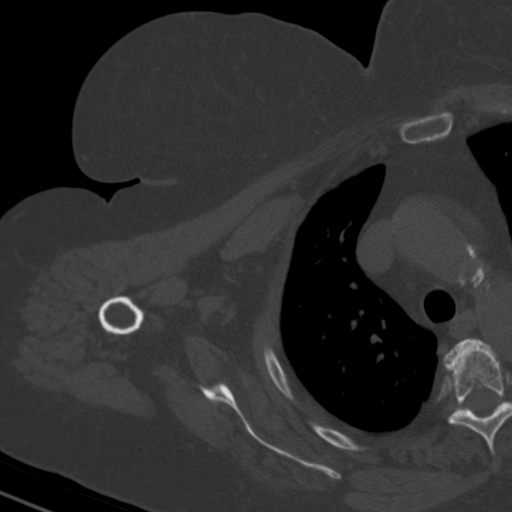
[im 62/101  bone]
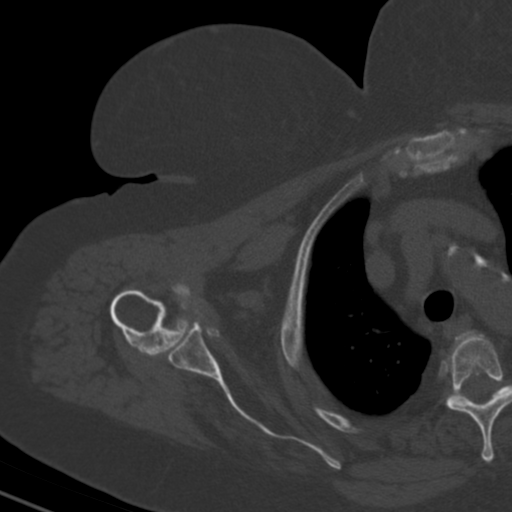
[im 70/101  soft-tissue]
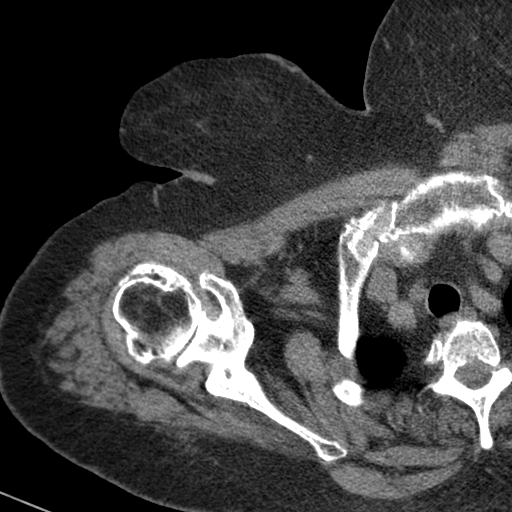
[im 70/101  bone]
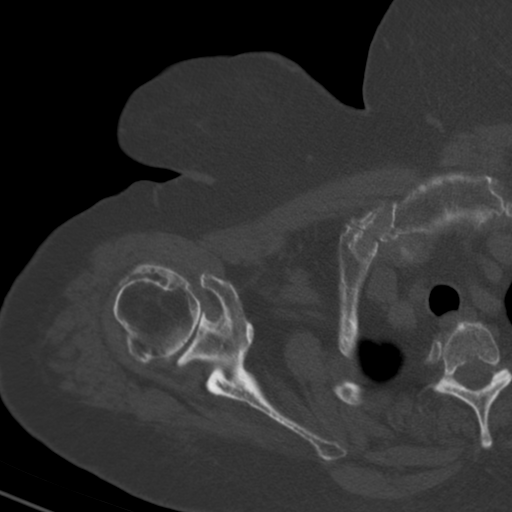
[im 77/101  bone]
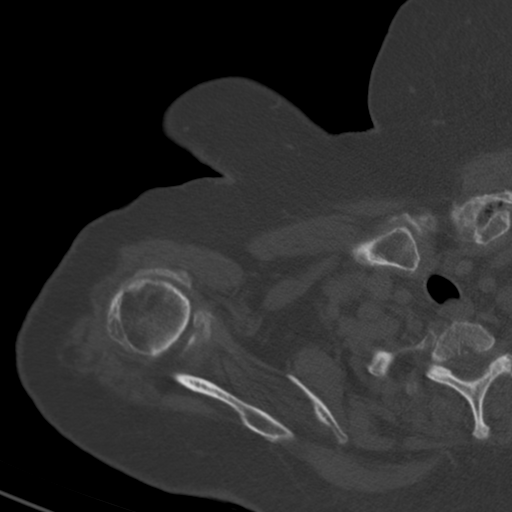
[im 85/101  bone]
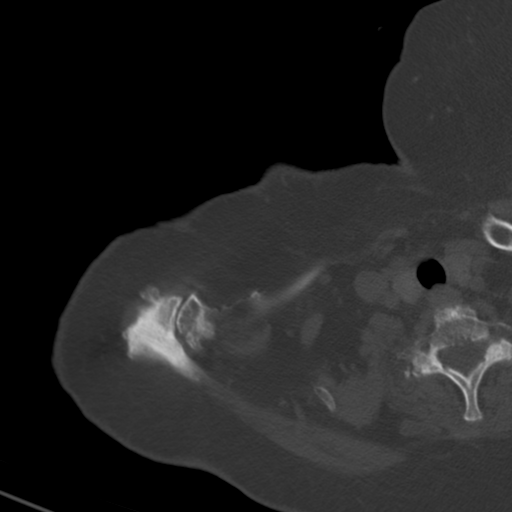
[im 93/101  bone]
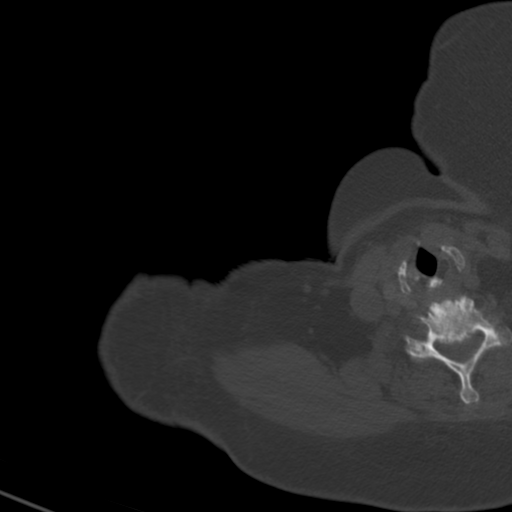

[12 of 14 positions shown; findings below may reference images not displayed]

FINDINGS: Bones/Joint/Cartilage

No fracture or dislocation. Normal alignment. No joint effusion.

Severe glenohumeral joint space narrowing with a bone-on-bone
appearance, marginal osteophytes and subchondral cystic changes.
Loose body along the superior aspect of the glenoid. Severe
narrowing of acromiohumeral distance as can be seen with a chronic
supraspinatus/infraspinatus tendon tear. Severe arthropathy of the
acromioclavicular joint.

No aggressive osseous lesion. No periosteal reaction or bone
destruction.

Ligaments

Ligaments are suboptimally evaluated by CT.

Muscles and Tendons
Muscle atrophy of the supraspinatus and infraspinatus muscles.
Severe narrowing of acromiohumeral distance as can be seen with a
chronic supraspinatus/infraspinatus tendon tear.

Soft tissue
No fluid collection or hematoma.  No soft tissue mass.
IMPRESSION: 1. Severe advanced osteoarthritis of the right glenohumeral joint.
2. Severe narrowing of acromiohumeral distance as can be seen with
chronic supraspinatus/infraspinatus tendon tear.
3. Muscle atrophy of the supraspinatus and infraspinatus muscles.

## 2019-11-18 ENCOUNTER — Other Ambulatory Visit: Payer: Self-pay | Admitting: Orthopedic Surgery

## 2019-11-18 DIAGNOSIS — M25512 Pain in left shoulder: Secondary | ICD-10-CM

## 2019-11-25 ENCOUNTER — Ambulatory Visit
Admission: RE | Admit: 2019-11-25 | Discharge: 2019-11-25 | Disposition: A | Discharge status: Home / Self Care | Payer: Medicare Other | Source: Ambulatory Visit | Attending: Orthopedic Surgery | Admitting: Orthopedic Surgery

## 2019-11-25 DIAGNOSIS — M25512 Pain in left shoulder: Secondary | ICD-10-CM

## 2019-12-30 IMAGING — DX PORTABLE RIGHT SHOULDER
2 series · 2 of 2 positions shown · non-contrast
Comparison: None.

CLINICAL DATA: Right shoulder replacement

EXAM:
PORTABLE RIGHT SHOULDER

[shoulder ap (1 of 2)]
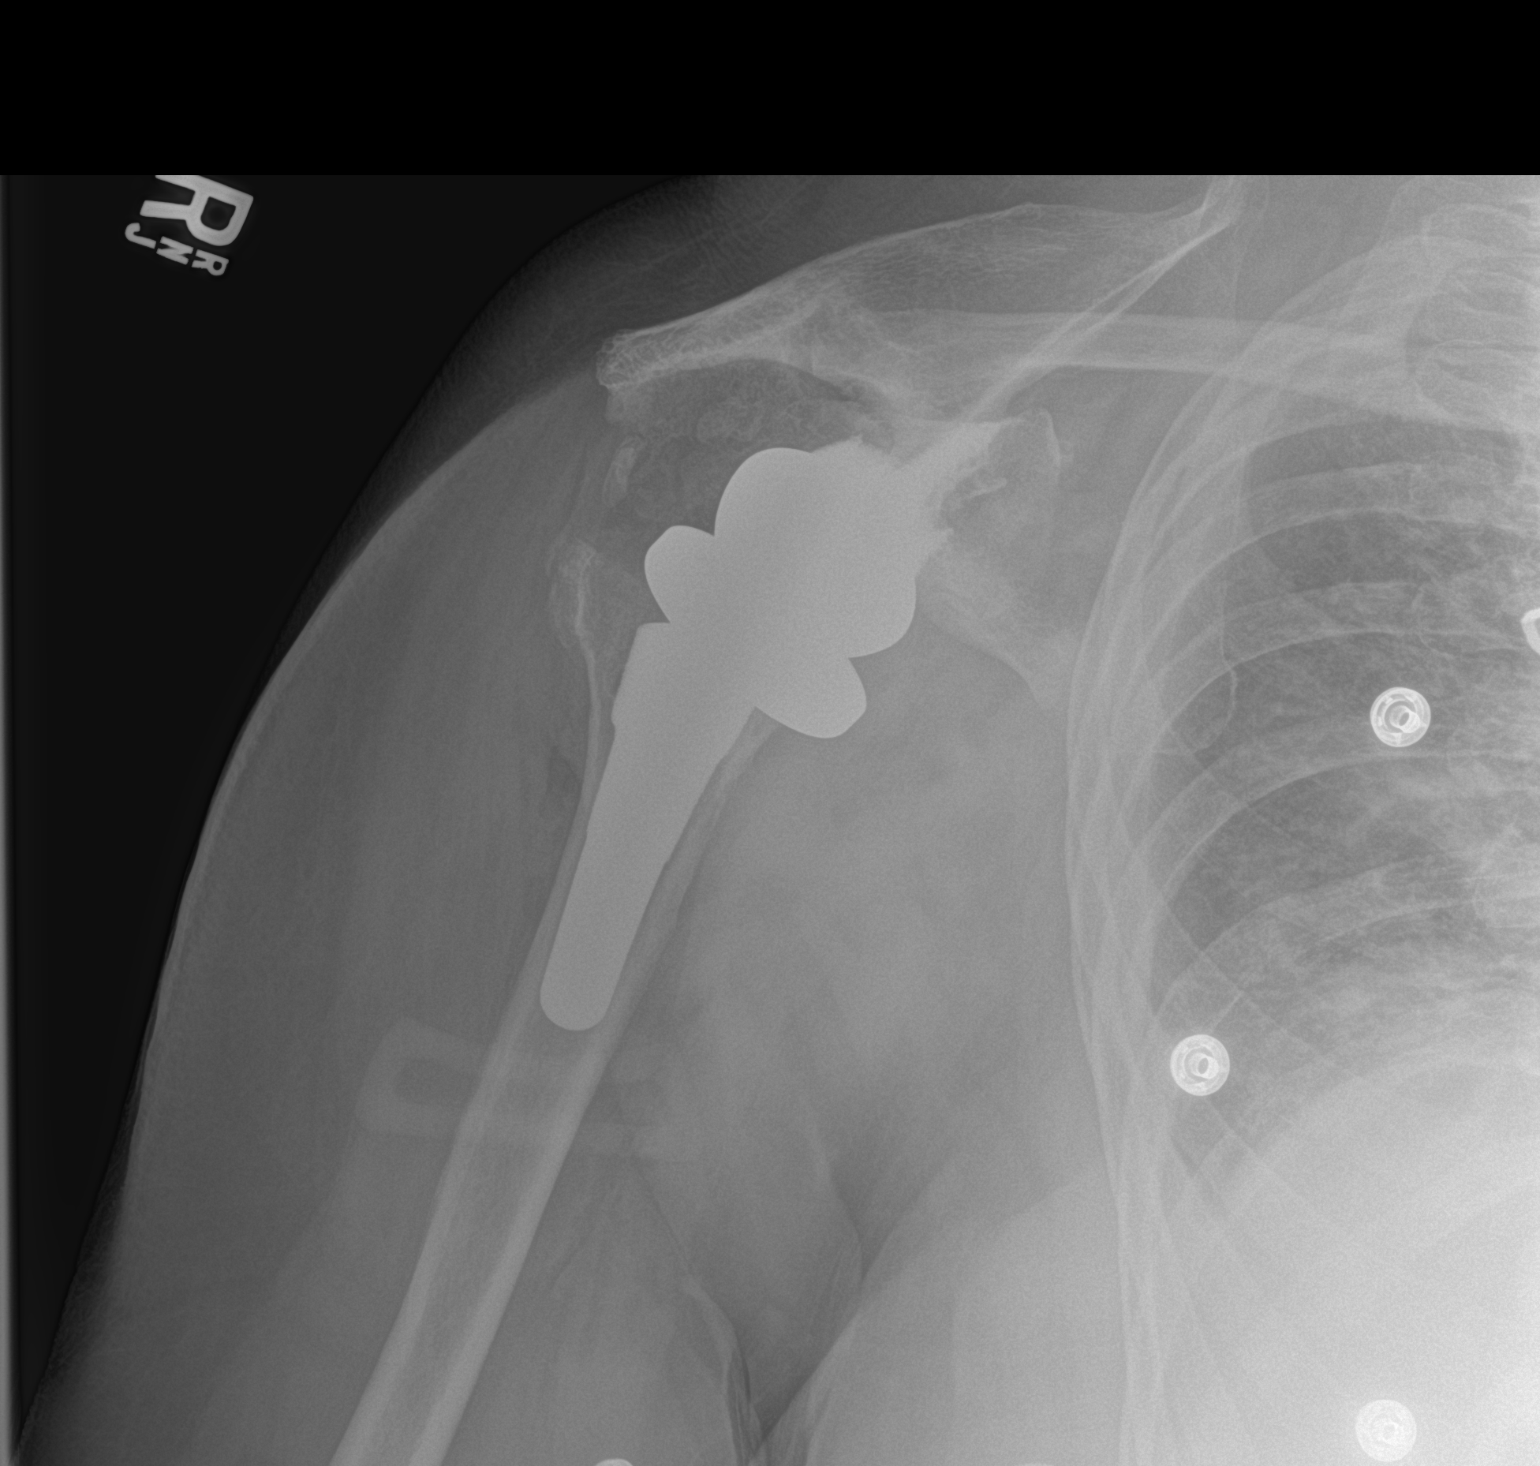

[shoulder ap (2 of 2)]
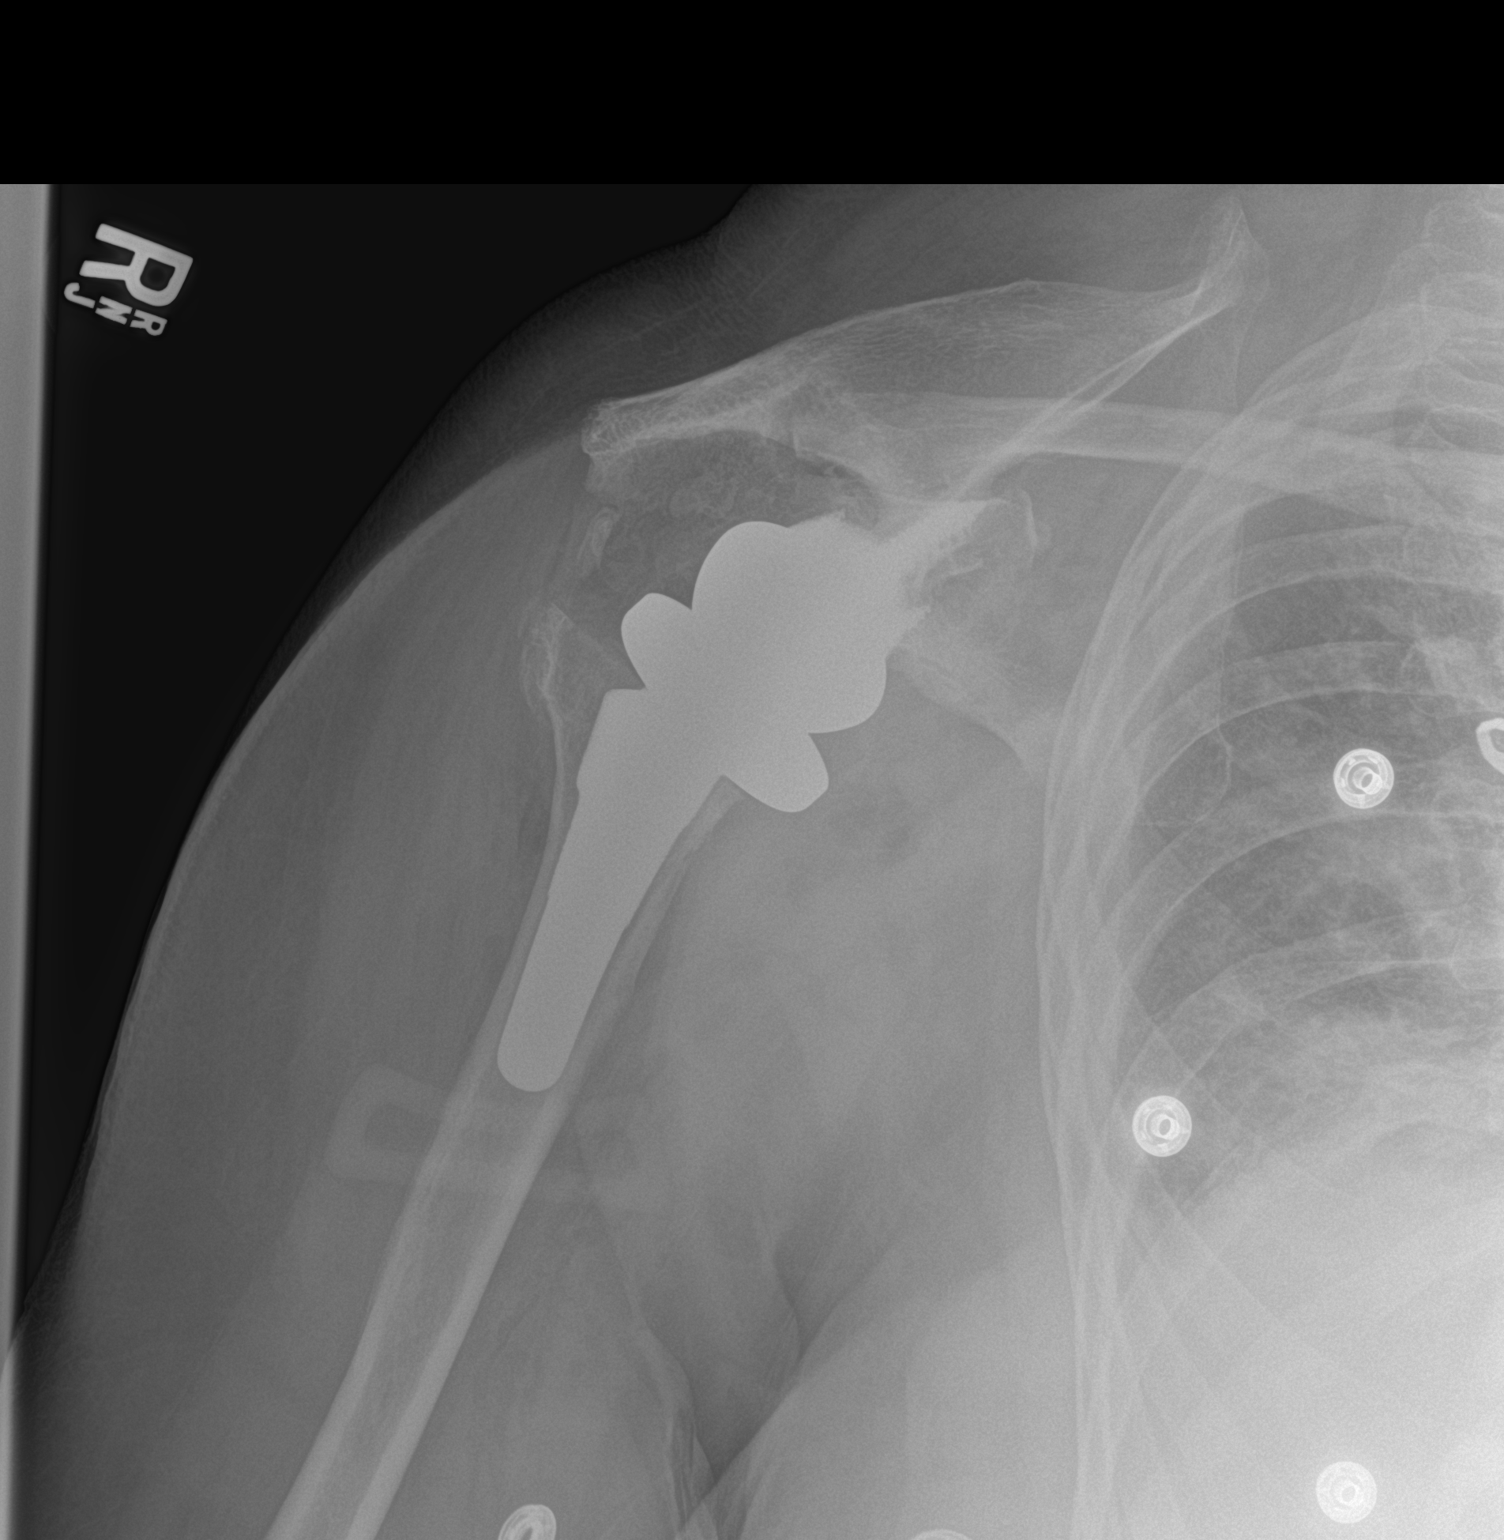

[2 of 2 positions shown; findings below may reference images not displayed]

FINDINGS: Interval right total reverse shoulder arthroplasty. Normal
alignment. No hardware failure or complication. No acute fracture or
dislocation.
IMPRESSION: Interval right total reverse shoulder arthroplasty.

## 2020-01-08 NOTE — Patient Instructions (Addendum)
DUE TO COVID-19 ONLY ONE VISITOR IS ALLOWED TO COME WITH YOU AND STAY IN THE WAITING ROOM ONLY DURING PRE OP AND PROCEDURE DAY OF SURGERY. THE 1 VISITOR MAY VISIT WITH YOU AFTER SURGERY IN YOUR PRIVATE ROOM DURING VISITING HOURS ONLY!  YOU NEED TO HAVE A COVID 19 TEST ON 01-10-20 @ 2:30 PM, THIS TEST MUST BE DONE BEFORE SURGERY, COME  [place], [place] , [id number].  ([place]) ONCE YOUR COVID TEST IS COMPLETED, PLEASE BEGIN THE QUARANTINE INSTRUCTIONS AS OUTLINED IN YOUR HANDOUT.                Kimberly YARBOUGH  01/08/2020   Your procedure is scheduled on: 01-14-20   Report to [facility] Main  Entrance    Report to Short Stay at 5:30 AM     Call this number if you have problems the morning of surgery [phone]    Remember: NO SOLID FOOD AFTER MIDNIGHT THE NIGHT PRIOR TO SURGERY. NOTHING BY MOUTH EXCEPT CLEAR LIQUIDS UNTIL 4:30 AM. PLEASE FINISH G2 DRINK PER SURGEON ORDER  WHICH NEEDS TO BE COMPLETED AT 4:30 AM.   CLEAR LIQUID DIET   Foods Allowed                                                                     Foods Excluded  Coffee and tea, regular and decaf                             liquids that you cannot  Plain Jell-O any favor except red or purple                                           see through such as: Fruit ices (not with fruit pulp)                                     milk, soups, orange juice  Iced Popsicles                                    All solid food Carbonated beverages, regular and diet                                    Cranberry, grape and apple juices Sports drinks like Gatorade Lightly seasoned clear broth or consume(fat free) Sugar, honey syrup  Sample Menu Breakfast                                Lunch                                     Supper Cranberry juice  Beef broth                            Chicken broth Jell-O                                     Grape juice                            Apple juice Coffee or tea                        Jell-O                                      Popsicle                                                Coffee or tea                        Coffee or tea  _____________________________________________________________________       Take these medicines the morning of surgery with A SIP OF WATER:  Allopurinol (Zyloprim), Amlodipine (Norvasc), Atoravastin (Lipitor), Carvedilol (Coreg), Famotidine (Pepcid), and Tramadol. You may also use and bring your nasal spray  BRUSH YOUR TEETH MORNING OF SURGERY AND RINSE YOUR MOUTH OUT, NO CHEWING GUM CANDY OR MINTS.  DO NOT TAKE ANY DIABETIC MEDICATIONS DAY OF YOUR SURGERY                               You may not have any metal on your body including hair pins and              piercings     Do not wear jewelry, make-up, lotions, powders or perfumes, deodorant              Do not wear nail polish on your fingernails.  Do not shave  48 hours prior to surgery.             Do not bring valuables to the hospital. [facility] IS NOT             RESPONSIBLE   FOR VALUABLES.  Contacts, dentures or bridgework may not be worn into surgery.  You may bring your overnight bag     Special Instructions: N/A              Please read over the following fact sheets you were given: _____________________________________________________________________   How to Manage Your Diabetes Before and After Surgery  Why is it important to control my blood sugar before and after surgery? . Improving blood sugar levels before and after surgery helps healing and can limit problems. . A way of improving blood sugar control is eating a healthy diet by: o  Eating less sugar and carbohydrates o  Increasing activity/exercise o  Talking with your doctor about reaching your blood sugar goals . High blood sugars (greater than 180 mg/dL) can raise your risk of infections and slow your recovery, so you will  need to focus on  controlling your diabetes during the weeks before surgery. . Make sure that the doctor who takes care of your diabetes knows about your planned surgery including the date and location.  How do I manage my blood sugar before surgery? . Check your blood sugar at least 4 times a day, starting 2 days before surgery, to make sure that the level is not too high or low. o Check your blood sugar the morning of your surgery when you wake up and every 2 hours until you get to the Short Stay unit. . If your blood sugar is less than 70 mg/dL, you will need to treat for low blood sugar: o Do not take insulin. o Treat a low blood sugar (less than 70 mg/dL) with  cup of clear juice (cranberry or apple), 4 glucose tablets, OR glucose gel. o Recheck blood sugar in 15 minutes after treatment (to make sure it is greater than 70 mg/dL). If your blood sugar is not greater than 70 mg/dL on recheck, call [phone] for further instructions. . Report your blood sugar to the short stay nurse when you get to Short Stay.  . If you are admitted to the hospital after surgery: o Your blood sugar will be checked by the staff and you will probably be given insulin after surgery (instead of oral diabetes medicines) to make sure you have good blood sugar levels. o The goal for blood sugar control after surgery is 80-180 mg/dL.   WHAT DO I DO ABOUT MY DIABETES MEDICATION?  Marland Kitchen Do not take oral diabetes medicines (pills) the morning of surgery.  . THE DAY BEFORE SURGERY, take your usual Actos      . The day of surgery, do not take other diabetes injectables, including Byetta (exenatide), Bydureon (exenatide ER), Victoza (liraglutide), or Trulicity (dulaglutide).   Reviewed and Endorsed by [facility] Patient Education Committee, August 2015     [facility]- Preparing for Total Shoulder Arthroplasty    Before surgery, you can play an important role. Because skin is not sterile, your skin needs to be as free of germs as  possible. You can reduce the number of germs on your skin by using the following products. . Benzoyl Peroxide Gel o Reduces the number of germs present on the skin o Applied twice a day to shoulder area starting two days before surgery    ==================================================================  Please follow these instructions carefully:  BENZOYL PEROXIDE 5% GEL  Please do not use if you have an allergy to benzoyl peroxide.   If your skin becomes reddened/irritated stop using the benzoyl peroxide.  Starting two days before surgery, apply as follows: 1. Apply benzoyl peroxide in the morning and at night. Apply after taking a shower. If you are not taking a shower clean entire shoulder front, back, and side along with the armpit with a clean wet washcloth.  2. Place a quarter-sized dollop on your shoulder and rub in thoroughly, making sure to cover the front, back, and side of your shoulder, along with the armpit.   2 days before ____ AM   ____ PM              1 day before ____ AM   ____ PM                         3. Do this twice a day for two days.  (Last application is the night before surgery, AFTER  using the CHG soap as described below).  4. Do NOT apply benzoyl peroxide gel on the day of surgery.             [facility] - Preparing for Surgery Before surgery, you can play an important role.  Because skin is not sterile, your skin needs to be as free of germs as possible.  You can reduce the number of germs on your skin by washing with CHG (chlorahexidine gluconate) soap before surgery.  CHG is an antiseptic cleaner which kills germs and bonds with the skin to continue killing germs even after washing. Please DO NOT use if you have an allergy to CHG or antibacterial soaps.  If your skin becomes reddened/irritated stop using the CHG and inform your nurse when you arrive at Short Stay. Do not shave (including legs and underarms) for at least 48 hours prior to the first CHG  shower.  You may shave your face/neck. Please follow these instructions carefully:  1.  Shower with CHG Soap the night before surgery and the  morning of Surgery.  2.  If you choose to wash your hair, wash your hair first as usual with your  normal  shampoo.  3.  After you shampoo, rinse your hair and body thoroughly to remove the  shampoo.                           4.  Use CHG as you would any other liquid soap.  You can apply chg directly  to the skin and wash                       Gently with a scrungie or clean washcloth.  5.  Apply the CHG Soap to your body ONLY FROM THE NECK DOWN.   Do not use on face/ open                           Wound or open sores. Avoid contact with eyes, ears mouth and genitals (private parts).                       Wash face,  Genitals (private parts) with your normal soap.             6.  Wash thoroughly, paying special attention to the area where your surgery  will be performed.  7.  Thoroughly rinse your body with warm water from the neck down.  8.  DO NOT shower/wash with your normal soap after using and rinsing off  the CHG Soap.                9.  Pat yourself dry with a clean towel.            10.  Wear clean pajamas.            11.  Place clean sheets on your bed the night of your first shower and do not  sleep with pets. Day of Surgery : Do not apply any lotions/deodorants the morning of surgery.  Please wear clean clothes to the hospital/surgery center.  FAILURE TO FOLLOW THESE INSTRUCTIONS MAY RESULT IN THE CANCELLATION OF YOUR SURGERY PATIENT SIGNATURE_________________________________  NURSE SIGNATURE__________________________________  ________________________________________________________________________   Rogelia Mire  An incentive spirometer is a tool that can help keep your lungs clear and active. This tool measures how well you  are filling your lungs with each breath. Taking long deep breaths may help reverse or decrease the chance  of developing breathing (pulmonary) problems (especially infection) following:  A long period of time when you are unable to move or be active. BEFORE THE PROCEDURE   If the spirometer includes an indicator to show your best effort, your nurse or respiratory therapist will set it to a desired goal.  If possible, sit up straight or lean slightly forward. Try not to slouch.  Hold the incentive spirometer in an upright position. INSTRUCTIONS FOR USE  1. Sit on the edge of your bed if possible, or sit up as far as you can in bed or on a chair. 2. Hold the incentive spirometer in an upright position. 3. Breathe out normally. 4. Place the mouthpiece in your mouth and seal your lips tightly around it. 5. Breathe in slowly and as deeply as possible, raising the piston or the ball toward the top of the column. 6. Hold your breath for 3-5 seconds or for as long as possible. Allow the piston or ball to fall to the bottom of the column. 7. Remove the mouthpiece from your mouth and breathe out normally. 8. Rest for a few seconds and repeat Steps 1 through 7 at least 10 times every 1-2 hours when you are awake. Take your time and take a few normal breaths between deep breaths. 9. The spirometer may include an indicator to show your best effort. Use the indicator as a goal to work toward during each repetition. 10. After each set of 10 deep breaths, practice coughing to be sure your lungs are clear. If you have an incision (the cut made at the time of surgery), support your incision when coughing by placing a pillow or rolled up towels firmly against it. Once you are able to get out of bed, walk around indoors and cough well. You may stop using the incentive spirometer when instructed by your caregiver.  RISKS AND COMPLICATIONS  Take your time so you do not get dizzy or light-headed.  If you are in pain, you may need to take or ask for pain medication before doing incentive spirometry. It is harder to  take a deep breath if you are having pain. AFTER USE  Rest and breathe slowly and easily.  It can be helpful to keep track of a log of your progress. Your caregiver can provide you with a simple table to help with this. If you are using the spirometer at home, follow these instructions: SEEK MEDICAL CARE IF:   You are having difficultly using the spirometer.  You have trouble using the spirometer as often as instructed.  Your pain medication is not giving enough relief while using the spirometer.  You develop fever of 100.5 F (38.1 C) or higher. SEEK IMMEDIATE MEDICAL CARE IF:   You cough up bloody sputum that had not been present before.  You develop fever of 102 F (38.9 C) or greater.  You develop worsening pain at or near the incision site. MAKE SURE YOU:   Understand these instructions.  Will watch your condition.  Will get help right away if you are not doing well or get worse. Document Released: 03/13/2007 Document Revised: 01/23/2012 Document Reviewed: 05/14/2007 ExitCare Patient Information 2014 [employer], [place].   ________________________________________________________________________  WHAT IS A BLOOD TRANSFUSION? Blood Transfusion Information  A transfusion is the replacement of blood or some of its parts. Blood is made up of multiple cells which provide  different functions.  Red blood cells carry oxygen and are used for blood loss replacement.  White blood cells fight against infection.  Platelets control bleeding.  Plasma helps clot blood.  Other blood products are available for specialized needs, such as hemophilia or other clotting disorders. BEFORE THE TRANSFUSION  Who gives blood for transfusions?   Healthy volunteers who are fully evaluated to make sure their blood is safe. This is blood bank blood. Transfusion therapy is the safest it has ever been in the practice of medicine. Before blood is taken from a donor, a complete history is taken to  make sure that person has no history of diseases nor engages in risky social behavior (examples are intravenous drug use or sexual activity with multiple partners). The donor's travel history is screened to minimize risk of transmitting infections, such as malaria. The donated blood is tested for signs of infectious diseases, such as HIV and hepatitis. The blood is then tested to be sure it is compatible with you in order to minimize the chance of a transfusion reaction. If you or a relative donates blood, this is often done in anticipation of surgery and is not appropriate for emergency situations. It takes many days to process the donated blood. RISKS AND COMPLICATIONS Although transfusion therapy is very safe and saves many lives, the main dangers of transfusion include:   Getting an infectious disease.  Developing a transfusion reaction. This is an allergic reaction to something in the blood you were given. Every precaution is taken to prevent this. The decision to have a blood transfusion has been considered carefully by your caregiver before blood is given. Blood is not given unless the benefits outweigh the risks. AFTER THE TRANSFUSION  Right after receiving a blood transfusion, you will usually feel much better and more energetic. This is especially true if your red blood cells have gotten low (anemic). The transfusion raises the level of the red blood cells which carry oxygen, and this usually causes an energy increase.  The nurse administering the transfusion will monitor you carefully for complications. HOME CARE INSTRUCTIONS  No special instructions are needed after a transfusion. You may find your energy is better. Speak with your caregiver about any limitations on activity for underlying diseases you may have. SEEK MEDICAL CARE IF:   Your condition is not improving after your transfusion.  You develop redness or irritation at the intravenous (IV) site. SEEK IMMEDIATE MEDICAL CARE  IF:  Any of the following symptoms occur over the next 12 hours:  Shaking chills.  You have a temperature by mouth above 102 F (38.9 C), not controlled by medicine.  Chest, back, or muscle pain.  People around you feel you are not acting correctly or are confused.  Shortness of breath or difficulty breathing.  Dizziness and fainting.  You get a rash or develop hives.  You have a decrease in urine output.  Your urine turns a dark color or changes to pink, red, or brown. Any of the following symptoms occur over the next 10 days:  You have a temperature by mouth above 102 F (38.9 C), not controlled by medicine.  Shortness of breath.  Weakness after normal activity.  The white part of the eye turns yellow (jaundice).  You have a decrease in the amount of urine or are urinating less often.  Your urine turns a dark color or changes to pink, red, or brown. Document Released: 10/28/2000 Document Revised: 01/23/2012 Document Reviewed: 06/16/2008 ExitCare Patient Information 2014  [employer].  _______________________________________________________________________

## 2020-01-08 NOTE — Progress Notes (Signed)
Please sent orders. Pt is scheduled for her PAT on 01/09/20

## 2020-01-08 NOTE — Progress Notes (Signed)
PCP - Kaleen Odea, MD  Cardiologist - Cleda Clarks PA [facility],  w/clearance on chart and EPIC dated 12-26-19  Nephrology clearance from Dr. Felipa Furnace dated 12-17-19 on chart  Chest x-ray -  EKG - 06-02-20 Stress Test -  ECHO -  Cardiac Cath -   Sleep Study -  CPAP -   Fasting Blood Sugar -  Checks Blood Sugar _____ times a day  Blood Thinner Instructions: Plavix w/instructions to stop 7 days prior to surgery, restart 2 days after. (on Chart)  Aspirin Instructions: Continue (on chart) Last Dose:  Anesthesia review: Hx of HTN, CRF, CAD, Aortic valve stenosis  Patient denies shortness of breath, fever, cough and chest pain at PAT appointment   Patient verbalized understanding of instructions that were given to them at the PAT appointment. Patient was also instructed that they will need to review over the PAT instructions again at home before surgery.

## 2020-01-09 ENCOUNTER — Other Ambulatory Visit: Payer: Self-pay

## 2020-01-09 ENCOUNTER — Encounter (HOSPITAL_COMMUNITY): Payer: Self-pay

## 2020-01-09 ENCOUNTER — Encounter (HOSPITAL_COMMUNITY)
Admission: RE | Admit: 2020-01-09 | Discharge: 2020-01-09 | Disposition: A | Discharge status: Home / Self Care | Payer: Medicare Other | Source: Ambulatory Visit | Attending: Orthopedic Surgery | Admitting: Orthopedic Surgery

## 2020-01-09 DIAGNOSIS — M19012 Primary osteoarthritis, left shoulder: Secondary | ICD-10-CM | POA: Insufficient documentation

## 2020-01-09 DIAGNOSIS — N183 Chronic kidney disease, stage 3 unspecified: Secondary | ICD-10-CM | POA: Diagnosis not present

## 2020-01-09 DIAGNOSIS — D509 Iron deficiency anemia, unspecified: Secondary | ICD-10-CM | POA: Insufficient documentation

## 2020-01-09 DIAGNOSIS — Z7901 Long term (current) use of anticoagulants: Secondary | ICD-10-CM | POA: Diagnosis not present

## 2020-01-09 DIAGNOSIS — E1122 Type 2 diabetes mellitus with diabetic chronic kidney disease: Secondary | ICD-10-CM | POA: Insufficient documentation

## 2020-01-09 DIAGNOSIS — E7800 Pure hypercholesterolemia, unspecified: Secondary | ICD-10-CM | POA: Diagnosis not present

## 2020-01-09 DIAGNOSIS — Z7982 Long term (current) use of aspirin: Secondary | ICD-10-CM | POA: Diagnosis not present

## 2020-01-09 DIAGNOSIS — I2510 Atherosclerotic heart disease of native coronary artery without angina pectoris: Secondary | ICD-10-CM | POA: Diagnosis not present

## 2020-01-09 DIAGNOSIS — Z79899 Other long term (current) drug therapy: Secondary | ICD-10-CM | POA: Insufficient documentation

## 2020-01-09 DIAGNOSIS — Z01812 Encounter for preprocedural laboratory examination: Secondary | ICD-10-CM | POA: Diagnosis not present

## 2020-01-09 DIAGNOSIS — Z01818 Encounter for other preprocedural examination: Secondary | ICD-10-CM | POA: Diagnosis present

## 2020-01-09 DIAGNOSIS — Z7902 Long term (current) use of antithrombotics/antiplatelets: Secondary | ICD-10-CM | POA: Insufficient documentation

## 2020-01-09 DIAGNOSIS — I130 Hypertensive heart and chronic kidney disease with heart failure and stage 1 through stage 4 chronic kidney disease, or unspecified chronic kidney disease: Secondary | ICD-10-CM | POA: Diagnosis not present

## 2020-01-09 DIAGNOSIS — K219 Gastro-esophageal reflux disease without esophagitis: Secondary | ICD-10-CM | POA: Diagnosis not present

## 2020-01-09 DIAGNOSIS — I509 Heart failure, unspecified: Secondary | ICD-10-CM | POA: Insufficient documentation

## 2020-01-09 LAB — CBC
HCT: 37.8 % (ref 36.0–46.0)
Hemoglobin: 12.4 g/dL (ref 12.0–15.0)
MCH: 34.8 pg — ABNORMAL HIGH (ref 26.0–34.0)
MCHC: 32.8 g/dL (ref 30.0–36.0)
MCV: 106.2 fL — ABNORMAL HIGH (ref 80.0–100.0)
Platelets: 175 10*3/uL (ref 150–400)
RBC: 3.56 MIL/uL — ABNORMAL LOW (ref 3.87–5.11)
RDW: 14 % (ref 11.5–15.5)
WBC: 5.4 10*3/uL (ref 4.0–10.5)
nRBC: 0 % (ref 0.0–0.2)

## 2020-01-09 LAB — COMPREHENSIVE METABOLIC PANEL
ALT: 19 U/L (ref 0–44)
AST: 23 U/L (ref 15–41)
Albumin: 3.8 g/dL (ref 3.5–5.0)
Alkaline Phosphatase: 72 U/L (ref 38–126)
Anion gap: 10 (ref 5–15)
BUN: 35 mg/dL — ABNORMAL HIGH (ref 8–23)
CO2: 24 mmol/L (ref 22–32)
Calcium: 9.1 mg/dL (ref 8.9–10.3)
Chloride: 104 mmol/L (ref 98–111)
Creatinine, Ser: 1.58 mg/dL — ABNORMAL HIGH (ref 0.44–1.00)
GFR calc Af Amer: 37 mL/min — ABNORMAL LOW (ref >60)
GFR calc non Af Amer: 32 mL/min — ABNORMAL LOW (ref >60)
Glucose, Bld: 95 mg/dL (ref 70–99)
Potassium: 4.2 mmol/L (ref 3.5–5.1)
Sodium: 138 mmol/L (ref 135–145)
Total Bilirubin: 0.7 mg/dL (ref 0.3–1.2)
Total Protein: 6.8 g/dL (ref 6.5–8.1)

## 2020-01-09 LAB — SURGICAL PCR SCREEN
MRSA, PCR: NEGATIVE
Staphylococcus aureus: POSITIVE — AB

## 2020-01-09 LAB — HEMOGLOBIN A1C
Hgb A1c MFr Bld: 5.5 % (ref 4.8–5.6)
Mean Plasma Glucose: 111.15 mg/dL

## 2020-01-10 ENCOUNTER — Other Ambulatory Visit (HOSPITAL_COMMUNITY)
Admission: RE | Admit: 2020-01-10 | Discharge: 2020-01-10 | Disposition: A | Discharge status: Home / Self Care | Payer: Medicare Other | Source: Ambulatory Visit | Attending: Orthopedic Surgery | Admitting: Orthopedic Surgery

## 2020-01-10 DIAGNOSIS — Z20822 Contact with and (suspected) exposure to covid-19: Secondary | ICD-10-CM | POA: Insufficient documentation

## 2020-01-10 DIAGNOSIS — Z01812 Encounter for preprocedural laboratory examination: Secondary | ICD-10-CM | POA: Insufficient documentation

## 2020-01-10 LAB — SARS CORONAVIRUS 2 (TAT 6-24 HRS): SARS Coronavirus 2: NEGATIVE

## 2020-01-10 NOTE — Progress Notes (Signed)
Anesthesia Chart Review   Case: [id number] Date/Time: 01/14/20 0715   Procedure: TOTAL SHOULDER ARTHROPLASTY (Left Shoulder)   Anesthesia type: Choice   Pre-op diagnosis: OA LEFT SHOULDER   Location: [facility]   Surgeons: Marchia Bond, MD      DISCUSSION:73 y.o. never smoker with h/o DM II, HTN, GERD, CAD, CHF, CKD, aortic valve stenosis, left shoulder OA scheduled for above procedure 01/14/20 with Dr. Marchia Bond.   Cleared by nephrologist, Dr. Marlowe Sax (on chart).   Cleared by cardiology 12/26/2019.  Per OV note, "Stable. Clinically able to do usual activities.  Doing well on torsemide.  Encouraged to be as active as possible, especially prior to upcoming shoulder surgery to improve her recovery efforts.  There is no contraindication to upcoming shoulder surgery from a cardiac perspective.  She is no having any cardiac symptoms and she is mostly euvolemic.  This appears stable.  She not having any new symptoms therefore would not proceed with cardiac testing as this would not further reduce any risk.  She is at intermediate risk just due to her cardiac diagnosis, but these are stable and well optimized."   Anticipate pt can proceed with planned procedure barring acute status change.   VS: BP (!) 157/74   Pulse 61   Temp 36.6 C (Oral)   Resp 16   Ht 4\' 11"  (1.499 m)   Wt 90 kg   LMP  (LMP Unknown)   SpO2 99%   BMI 40.09 kg/m   PROVIDERS: Edson Snowball, FNP is PCP   Marlowe Sax, MD is Nephrologist   [place]  LABS: Labs reviewed: Acceptable for surgery. (all labs ordered are listed, but only abnormal results are displayed)  Labs Reviewed  CBC - Abnormal; Notable for the following components:      Result Value   RBC 3.56 (*)    MCV 106.2 (*)    MCH 34.8 (*)    All other components within normal limits  COMPREHENSIVE METABOLIC PANEL - Abnormal; Notable for the following components:   BUN 35 (*)    Creatinine, Ser 1.58 (*)    GFR calc non Af Amer  32 (*)    GFR calc Af Amer 37 (*)    All other components within normal limits  HEMOGLOBIN A1C     IMAGES:   EKG: 06/03/2019 Rate 54 bpm Sinus bradycardia Nonspecific T wave abnormality Abnormal ECG No previous tracing  CV: Echo 07/24/18 INTERPRETATION ---------------------------------------------------------------  NORMAL LEFT VENTRICULAR FUNCTION WITH MILD LVH  NORMAL LA PRESSURES WITH DIASTOLIC DYSFUNCTION  NORMAL RIGHT VENTRICULAR SYSTOLIC FUNCTION  VALVULAR REGURGITATION: TRIVIAL AR, TRIVIAL MR, MILD PR, TRIVIAL TR  VALVULAR STENOSIS: TRIVIAL AS  POOR SOUND TRANSMISSION  Stress Test 11/01/2013 FUNCTIONAL RESULTS (calculated via Gated SPECT)  Post Stress Image LV EF: 68 %  Stress EDV: 88 ml EDVI: 46 ml/m TID:  Stress ESV: 28 ml ESVI: 15 ml/m  Wall Motion Wall Thickening  Anterior: Normal Normal  Apex: Normal Normal  Inferior: Normal Normal  Lateral: Normal Normal  Septal: Normal Normal Past Medical History:  Diagnosis Date  . Aortic valve stenosis   . Arthritis   . CAD (coronary artery disease)   . CRF (chronic renal failure), stage 3 (moderate)   . Diabetes ([place])   . GERD (gastroesophageal reflux disease)   . Guaiac positive stools 07/25/2018  . Headache   . High cholesterol   . History of hiatal hernia   . Hypertension   .  IDA (iron deficiency anemia)   . Right rotator cuff tear arthropathy 06/11/2019    Past Surgical History:  Procedure Laterality Date  . Cataract surgery    . CHOLECYSTECTOMY    . COLONOSCOPY N/A 09/27/2018   Procedure: COLONOSCOPY;  Surgeon: Malissa Hippo, MD;  Location: [facility];  Service: Endoscopy;  Laterality: N/A;  2:00  . complete hyste    . CORONARY ANGIOPLASTY    . REPLACEMENT TOTAL KNEE     both knee, 2010, 2015  . REVERSE SHOULDER ARTHROPLASTY Right 06/11/2019   Procedure: REVERSE SHOULDER ARTHROPLASTY;  Surgeon: Teryl Lucy, MD;  Location: [facility] ORS;  Service: Orthopedics;  Laterality: Right;     MEDICATIONS: . acetaminophen (TYLENOL) 500 MG tablet  . allopurinol (ZYLOPRIM) 100 MG tablet  . amLODipine (NORVASC) 10 MG tablet  . aspirin EC 81 MG tablet  . atorvastatin (LIPITOR) 40 MG tablet  . azithromycin (ZITHROMAX) 250 MG tablet  . benazepril (LOTENSIN) 20 MG tablet  . Calcium Carbonate-Vit D-Min (CALCIUM 1200 PO)  . carvedilol (COREG) 25 MG tablet  . clopidogrel (PLAVIX) 75 MG tablet  . famotidine (PEPCID) 10 MG tablet  . fluticasone (VERAMYST) 27.5 MCG/SPRAY nasal spray  . folic acid (FOLVITE) 1 MG tablet  . GARLIC PO  . HYDROcodone-acetaminophen (NORCO) 10-325 MG tablet  . liraglutide (VICTOZA) 18 MG/3ML SOPN  . magnesium oxide (MAG-OX) 400 MG tablet  . Multiple Vitamin (MULTIVITAMIN) tablet  . Omega-3 Fatty Acids (FISH OIL) 1000 MG CAPS  . ondansetron (ZOFRAN) 4 MG tablet  . pioglitazone (ACTOS) 30 MG tablet  . sennosides-docusate sodium (SENOKOT-S) 8.6-50 MG tablet  . torsemide (DEMADEX) 20 MG tablet  . traMADol (ULTRAM) 50 MG tablet  . vitamin B-12 (CYANOCOBALAMIN) 1000 MCG tablet   No current facility-administered medications for this encounter.    Janey Genta [facility] Pre-Surgical Testing [phone] 01/10/20  2:05 PM

## 2020-01-13 NOTE — Anesthesia Preprocedure Evaluation (Addendum)
Anesthesia Evaluation  Patient identified by MRN, date of birth, ID band Patient awake    Reviewed: Allergy & Precautions, NPO status , Patient's Chart, lab work & pertinent test results  Airway Mallampati: II  TM Distance: >3 FB Neck ROM: Full    Dental no notable dental hx. (+) Teeth Intact, Dental Advisory Given   Pulmonary neg pulmonary ROS,    Pulmonary exam normal breath sounds clear to auscultation       Cardiovascular hypertension, Pt. on medications and Pt. on home beta blockers + CAD and + Cardiac Stents  Normal cardiovascular exam+ Valvular Problems/Murmurs AS  Rhythm:Regular Rate:Normal     Neuro/Psych negative neurological ROS  negative psych ROS   GI/Hepatic hiatal hernia, GERD  ,  Endo/Other  diabetes, Type 2, Oral Hypoglycemic Agents  Renal/GU CRFRenal diseaseK+ 4.2 Cr 1.58     Musculoskeletal  (+) Arthritis ,   Abdominal (+) + obese,   Peds  Hematology  (+) Blood dyscrasia, , Hgb 12.4 On plavix   Anesthesia Other Findings All; diclofenac, mobic  Reproductive/Obstetrics                            Anesthesia Physical Anesthesia Plan  ASA: III  Anesthesia Plan: General   Post-op Pain Management:  Regional for Post-op pain   Induction:   PONV Risk Score and Plan: 3 and Treatment may vary due to age or medical condition, Ondansetron and Dexamethasone  Airway Management Planned: Oral ETT  Additional Equipment: None  Intra-op Plan:   Post-operative Plan: Extubation in OR  Informed Consent: I have reviewed the patients History and Physical, chart, labs and discussed the procedure including the risks, benefits and alternatives for the proposed anesthesia with the patient or authorized representative who has indicated his/her understanding and acceptance.     Dental advisory given  Plan Discussed with: CRNA  Anesthesia Plan Comments: (GA w L ISB exparel)        Anesthesia Quick Evaluation

## 2020-01-14 ENCOUNTER — Ambulatory Visit (HOSPITAL_COMMUNITY)
Admission: RE | Admit: 2020-01-14 | Discharge: 2020-01-14 | Disposition: A | Discharge status: Home / Self Care | Payer: Medicare Other | Attending: Orthopedic Surgery | Admitting: Orthopedic Surgery

## 2020-01-14 ENCOUNTER — Encounter (HOSPITAL_COMMUNITY)
Admission: RE | Disposition: A | Discharge status: Home / Self Care | Payer: Self-pay | Source: Home / Self Care | Attending: Orthopedic Surgery

## 2020-01-14 ENCOUNTER — Ambulatory Visit (HOSPITAL_COMMUNITY): Payer: Medicare Other | Admitting: Physician Assistant

## 2020-01-14 ENCOUNTER — Encounter (HOSPITAL_COMMUNITY): Payer: Self-pay | Admitting: Orthopedic Surgery

## 2020-01-14 ENCOUNTER — Ambulatory Visit (HOSPITAL_COMMUNITY): Payer: Medicare Other | Admitting: Anesthesiology

## 2020-01-14 DIAGNOSIS — E669 Obesity, unspecified: Secondary | ICD-10-CM | POA: Insufficient documentation

## 2020-01-14 DIAGNOSIS — I129 Hypertensive chronic kidney disease with stage 1 through stage 4 chronic kidney disease, or unspecified chronic kidney disease: Secondary | ICD-10-CM | POA: Insufficient documentation

## 2020-01-14 DIAGNOSIS — E1122 Type 2 diabetes mellitus with diabetic chronic kidney disease: Secondary | ICD-10-CM | POA: Insufficient documentation

## 2020-01-14 DIAGNOSIS — M25512 Pain in left shoulder: Secondary | ICD-10-CM | POA: Diagnosis present

## 2020-01-14 DIAGNOSIS — K219 Gastro-esophageal reflux disease without esophagitis: Secondary | ICD-10-CM | POA: Diagnosis not present

## 2020-01-14 DIAGNOSIS — Z7902 Long term (current) use of antithrombotics/antiplatelets: Secondary | ICD-10-CM | POA: Insufficient documentation

## 2020-01-14 DIAGNOSIS — E7800 Pure hypercholesterolemia, unspecified: Secondary | ICD-10-CM | POA: Diagnosis not present

## 2020-01-14 DIAGNOSIS — Z955 Presence of coronary angioplasty implant and graft: Secondary | ICD-10-CM | POA: Diagnosis not present

## 2020-01-14 DIAGNOSIS — N183 Chronic kidney disease, stage 3 unspecified: Secondary | ICD-10-CM | POA: Diagnosis not present

## 2020-01-14 DIAGNOSIS — M19012 Primary osteoarthritis, left shoulder: Secondary | ICD-10-CM | POA: Diagnosis not present

## 2020-01-14 DIAGNOSIS — Z6841 Body Mass Index (BMI) 40.0 and over, adult: Secondary | ICD-10-CM | POA: Insufficient documentation

## 2020-01-14 DIAGNOSIS — Z7982 Long term (current) use of aspirin: Secondary | ICD-10-CM | POA: Diagnosis not present

## 2020-01-14 DIAGNOSIS — I2510 Atherosclerotic heart disease of native coronary artery without angina pectoris: Secondary | ICD-10-CM | POA: Insufficient documentation

## 2020-01-14 DIAGNOSIS — Z79899 Other long term (current) drug therapy: Secondary | ICD-10-CM | POA: Insufficient documentation

## 2020-01-14 HISTORY — PX: REVERSE SHOULDER ARTHROPLASTY: SHX5054

## 2020-01-14 LAB — GLUCOSE, CAPILLARY
Glucose-Capillary: 131 mg/dL — ABNORMAL HIGH (ref 70–99)
Glucose-Capillary: 138 mg/dL — ABNORMAL HIGH (ref 70–99)

## 2020-01-14 SURGERY — ARTHROPLASTY, SHOULDER, TOTAL, REVERSE
Anesthesia: General | Site: Shoulder | Laterality: Left

## 2020-01-14 MED ORDER — ACETAMINOPHEN 500 MG PO TABS
1000.0000 mg | ORAL_TABLET | Freq: Once | ORAL | Status: AC
  Administered 2020-01-14: 1000 mg via ORAL
  Filled 2020-01-14: qty 2

## 2020-01-14 MED ORDER — ROCURONIUM BROMIDE 10 MG/ML (PF) SYRINGE
PREFILLED_SYRINGE | INTRAVENOUS | Status: DC | PRN
  Administered 2020-01-14: 10 mg via INTRAVENOUS
  Administered 2020-01-14: 60 mg via INTRAVENOUS

## 2020-01-14 MED ORDER — PROPOFOL 10 MG/ML IV BOLUS
INTRAVENOUS | Status: DC | PRN
  Administered 2020-01-14: 130 mg via INTRAVENOUS

## 2020-01-14 MED ORDER — DEXAMETHASONE SODIUM PHOSPHATE 10 MG/ML IJ SOLN
INTRAMUSCULAR | Status: DC | PRN
  Administered 2020-01-14: 10 mg via INTRAVENOUS

## 2020-01-14 MED ORDER — FENTANYL CITRATE (PF) 100 MCG/2ML IJ SOLN
INTRAMUSCULAR | Status: DC | PRN
  Administered 2020-01-14 (×3): 50 ug via INTRAVENOUS

## 2020-01-14 MED ORDER — BACLOFEN 10 MG PO TABS: 10.0000 mg | ORAL_TABLET | Freq: Three times a day (TID) | ORAL | 0 refills | Status: AC

## 2020-01-14 MED ORDER — LIDOCAINE 2% (20 MG/ML) 5 ML SYRINGE
INTRAMUSCULAR | Status: AC
  Filled 2020-01-14: qty 5

## 2020-01-14 MED ORDER — CEFAZOLIN SODIUM-DEXTROSE 2-4 GM/100ML-% IV SOLN
2.0000 g | INTRAVENOUS | Status: AC
  Administered 2020-01-14: 2 g via INTRAVENOUS
  Filled 2020-01-14: qty 100

## 2020-01-14 MED ORDER — MIDAZOLAM HCL 2 MG/2ML IJ SOLN
INTRAMUSCULAR | Status: AC
  Filled 2020-01-14: qty 2

## 2020-01-14 MED ORDER — PROPOFOL 10 MG/ML IV BOLUS
INTRAVENOUS | Status: AC
  Filled 2020-01-14: qty 20

## 2020-01-14 MED ORDER — BUPIVACAINE HCL (PF) 0.5 % IJ SOLN
INTRAMUSCULAR | Status: DC | PRN
  Administered 2020-01-14: 14 mL via PERINEURAL

## 2020-01-14 MED ORDER — CHLORHEXIDINE GLUCONATE 4 % EX LIQD: 60.0000 mL | Freq: Once | CUTANEOUS | Status: DC

## 2020-01-14 MED ORDER — ONDANSETRON HCL 4 MG/2ML IJ SOLN
INTRAMUSCULAR | Status: DC | PRN
  Administered 2020-01-14: 4 mg via INTRAVENOUS

## 2020-01-14 MED ORDER — PHENYLEPHRINE 40 MCG/ML (10ML) SYRINGE FOR IV PUSH (FOR BLOOD PRESSURE SUPPORT)
PREFILLED_SYRINGE | INTRAVENOUS | Status: AC
  Filled 2020-01-14: qty 10

## 2020-01-14 MED ORDER — EPHEDRINE 5 MG/ML INJ
INTRAVENOUS | Status: AC
  Filled 2020-01-14: qty 10

## 2020-01-14 MED ORDER — HYDROCODONE-ACETAMINOPHEN 10-325 MG PO TABS: 1.0000 | ORAL_TABLET | Freq: Four times a day (QID) | ORAL | 0 refills | Status: AC | PRN

## 2020-01-14 MED ORDER — ONDANSETRON HCL 4 MG PO TABS: 4.0000 mg | ORAL_TABLET | Freq: Three times a day (TID) | ORAL | 0 refills | Status: AC | PRN

## 2020-01-14 MED ORDER — BUPIVACAINE LIPOSOME 1.3 % IJ SUSP
INTRAMUSCULAR | Status: DC | PRN
  Administered 2020-01-14: 10 mL via PERINEURAL

## 2020-01-14 MED ORDER — POVIDONE-IODINE 10 % EX SWAB
2.0000 "application " | Freq: Once | CUTANEOUS | Status: AC
  Administered 2020-01-14: 2 via TOPICAL

## 2020-01-14 MED ORDER — ROCURONIUM BROMIDE 10 MG/ML (PF) SYRINGE
PREFILLED_SYRINGE | INTRAVENOUS | Status: AC
  Filled 2020-01-14: qty 10

## 2020-01-14 MED ORDER — FENTANYL CITRATE (PF) 100 MCG/2ML IJ SOLN
INTRAMUSCULAR | Status: AC
  Filled 2020-01-14: qty 2

## 2020-01-14 MED ORDER — EPHEDRINE SULFATE-NACL 50-0.9 MG/10ML-% IV SOSY
PREFILLED_SYRINGE | INTRAVENOUS | Status: DC | PRN
  Administered 2020-01-14 (×4): 10 mg via INTRAVENOUS

## 2020-01-14 MED ORDER — PROPOFOL 500 MG/50ML IV EMUL
INTRAVENOUS | Status: AC
  Filled 2020-01-14: qty 50

## 2020-01-14 MED ORDER — TRAMADOL HCL 50 MG PO TABS: 50.0000 mg | ORAL_TABLET | Freq: Three times a day (TID) | ORAL | 0 refills | Status: AC | PRN

## 2020-01-14 MED ORDER — GLYCOPYRROLATE PF 0.2 MG/ML IJ SOSY
PREFILLED_SYRINGE | INTRAMUSCULAR | Status: AC
  Filled 2020-01-14: qty 1

## 2020-01-14 MED ORDER — LIDOCAINE 2% (20 MG/ML) 5 ML SYRINGE
INTRAMUSCULAR | Status: DC | PRN
  Administered 2020-01-14: 80 mg via INTRAVENOUS

## 2020-01-14 MED ORDER — GLYCOPYRROLATE 0.2 MG/ML IJ SOLN
INTRAMUSCULAR | Status: DC | PRN
  Administered 2020-01-14: .2 mg via INTRAVENOUS

## 2020-01-14 MED ORDER — SODIUM CHLORIDE 0.9 % IR SOLN
Status: DC | PRN
  Administered 2020-01-14: 1000 mL

## 2020-01-14 MED ORDER — FENTANYL CITRATE (PF) 100 MCG/2ML IJ SOLN: 25.0000 ug | INTRAMUSCULAR | Status: DC | PRN

## 2020-01-14 MED ORDER — STERILE WATER FOR IRRIGATION IR SOLN
Status: DC | PRN
  Administered 2020-01-14: 1000 mL

## 2020-01-14 MED ORDER — DEXAMETHASONE SODIUM PHOSPHATE 10 MG/ML IJ SOLN
INTRAMUSCULAR | Status: AC
  Filled 2020-01-14: qty 1

## 2020-01-14 MED ORDER — SUGAMMADEX SODIUM 200 MG/2ML IV SOLN
INTRAVENOUS | Status: DC | PRN
  Administered 2020-01-14: 200 mg via INTRAVENOUS

## 2020-01-14 MED ORDER — MIDAZOLAM HCL 5 MG/5ML IJ SOLN
INTRAMUSCULAR | Status: DC | PRN
  Administered 2020-01-14: 1 mg via INTRAVENOUS

## 2020-01-14 MED ORDER — ONDANSETRON HCL 4 MG/2ML IJ SOLN
INTRAMUSCULAR | Status: AC
  Filled 2020-01-14: qty 2

## 2020-01-14 MED ORDER — ACETAMINOPHEN 10 MG/ML IV SOLN: 1000.0000 mg | Freq: Once | INTRAVENOUS | Status: DC | PRN

## 2020-01-14 MED ORDER — SENNA-DOCUSATE SODIUM 8.6-50 MG PO TABS: 2.0000 | ORAL_TABLET | Freq: Every day | ORAL | 1 refills | Status: AC

## 2020-01-14 MED ORDER — ONDANSETRON HCL 4 MG/2ML IJ SOLN: 4.0000 mg | Freq: Once | INTRAMUSCULAR | Status: DC | PRN

## 2020-01-14 MED ORDER — LACTATED RINGERS IV SOLN: INTRAVENOUS | Status: DC

## 2020-01-14 SURGICAL SUPPLY — 65 items
BAG ZIPLOCK 12X15 (MISCELLANEOUS) ×3 IMPLANT
BASEPLATE AUG MED W-TAPER (Plate) ×2 IMPLANT
BEARING HUMERAL SHLDER 36M STD (Shoulder) IMPLANT
BIT DRILL 2.7 W/STOP DISP (BIT) ×1 IMPLANT
BIT DRILL 2.7MM W/STOP DISP (BIT) ×1
BIT DRILL TWIST 2.7 (BIT) ×1 IMPLANT
BIT DRILL TWIST 2.7MM (BIT) ×1
BLADE SAW SAG 73X25 THK (BLADE) ×2
BLADE SAW SGTL 73X25 THK (BLADE) ×1 IMPLANT
BOOTIES KNEE HIGH SLOAN (MISCELLANEOUS) ×3 IMPLANT
BOWL SMART MIX CTS (DISPOSABLE) IMPLANT
CLOSURE STERI-STRIP 1/2X4 (GAUZE/BANDAGES/DRESSINGS) ×1
CLSR STERI-STRIP ANTIMIC 1/2X4 (GAUZE/BANDAGES/DRESSINGS) ×2 IMPLANT
COVER BACK TABLE 60X90IN (DRAPES) ×3 IMPLANT
COVER MAYO STAND STRL (DRAPES) ×3 IMPLANT
COVER SURGICAL LIGHT HANDLE (MISCELLANEOUS) ×3 IMPLANT
COVER WAND RF STERILE (DRAPES) IMPLANT
DECANTER SPIKE VIAL GLASS SM (MISCELLANEOUS) IMPLANT
DRAPE SHEET LG 3/4 BI-LAMINATE (DRAPES) ×6 IMPLANT
DRAPE SURG 17X11 SM STRL (DRAPES) ×3 IMPLANT
DRAPE U-SHAPE 47X51 STRL (DRAPES) ×3 IMPLANT
DRSG MEPILEX BORDER 4X8 (GAUZE/BANDAGES/DRESSINGS) ×3 IMPLANT
DURAPREP 26ML APPLICATOR (WOUND CARE) ×3 IMPLANT
ELECT REM PT RETURN 15FT ADLT (MISCELLANEOUS) ×3 IMPLANT
GLENOID SPHERE STD STRL 36MM (Orthopedic Implant) ×2 IMPLANT
GLOVE BIO SURGEON STRL SZ7 (GLOVE) ×3 IMPLANT
GLOVE BIOGEL PI IND STRL 7.0 (GLOVE) ×1 IMPLANT
GLOVE BIOGEL PI IND STRL 8 (GLOVE) ×1 IMPLANT
GLOVE BIOGEL PI INDICATOR 7.0 (GLOVE) ×2
GLOVE BIOGEL PI INDICATOR 8 (GLOVE) ×2
GLOVE ORTHO TXT STRL SZ7.5 (GLOVE) ×3 IMPLANT
GOWN STRL REUS W/TWL 2XL LVL3 (GOWN DISPOSABLE) ×3 IMPLANT
GOWN STRL REUS W/TWL LRG LVL3 (GOWN DISPOSABLE) ×3 IMPLANT
HOOD PEEL AWAY FLYTE STAYCOOL (MISCELLANEOUS) ×6 IMPLANT
KIT BASIN OR (CUSTOM PROCEDURE TRAY) ×3 IMPLANT
KIT TURNOVER KIT A (KITS) IMPLANT
PACK SHOULDER (CUSTOM PROCEDURE TRAY) ×3 IMPLANT
PENCIL SMOKE EVACUATOR (MISCELLANEOUS) IMPLANT
PIN STEINMANN THREADED TIP (PIN) ×2 IMPLANT
PIN THREADED REVERSE (PIN) ×2 IMPLANT
PROTECTOR NERVE ULNAR (MISCELLANEOUS) ×3 IMPLANT
REAMER GUIDE BUSHING SURG DISP (MISCELLANEOUS) ×2 IMPLANT
REAMER GUIDE W/SCREW AUG (MISCELLANEOUS) ×2 IMPLANT
RESTRAINT HEAD UNIVERSAL NS (MISCELLANEOUS) ×3 IMPLANT
SCREW BONE STRL 6.5MMX30MM (Screw) ×2 IMPLANT
SCREW LOCKING 4.75MMX15MM (Screw) ×4 IMPLANT
SCREW LOCKING NS 4.75MMX20MM (Screw) ×2 IMPLANT
SCREW LOCKING STRL 4.75X25X3.5 (Screw) ×2 IMPLANT
SHOULDER HUMERAL BEAR 36M STD (Shoulder) ×3 IMPLANT
SLING ARM FOAM STRAP LRG (SOFTGOODS) ×2 IMPLANT
SLING ARM IMMOBILIZER LRG (SOFTGOODS) ×3 IMPLANT
STEM HUMERAL STRL 9MX55MM (Stem) ×2 IMPLANT
SUCTION FRAZIER HANDLE 12FR (TUBING) ×2
SUCTION TUBE FRAZIER 12FR DISP (TUBING) ×1 IMPLANT
SUPPORT WRAP ARM LG (MISCELLANEOUS) ×3 IMPLANT
SUT FIBERWIRE #2 38 REV NDL BL (SUTURE) ×12
SUT VIC AB 1 CT1 36 (SUTURE) ×3 IMPLANT
SUT VIC AB 2-0 CT1 27 (SUTURE) ×2
SUT VIC AB 2-0 CT1 TAPERPNT 27 (SUTURE) ×1 IMPLANT
SUT VIC AB 3-0 SH 8-18 (SUTURE) ×3 IMPLANT
SUTURE FIBERWR#2 38 REV NDL BL (SUTURE) ×4 IMPLANT
TOWEL OR 17X26 10 PK STRL BLUE (TOWEL DISPOSABLE) ×3 IMPLANT
TOWEL OR NON WOVEN STRL DISP B (DISPOSABLE) ×3 IMPLANT
TOWER CARTRIDGE SMART MIX (DISPOSABLE) IMPLANT
TRAY HUM MINI SHOULDER +0 40D (Shoulder) ×2 IMPLANT

## 2020-01-14 NOTE — Evaluation (Signed)
Occupational Therapy Evaluation Patient Details Name: Kimberly Baldwin MRN: [id number] DOB: 04-29-1946 Today's Date: 01/14/2020    History of Present Illness 74 y o female s/p L RTSA, H/O R RTSA   Clinical Impression   Pt was seen in PACU following the above sx. All education was completed with her and her son.  Handouts given and they verbalize understanding. Pt will follow up with Dr Mardelle Matte for further rehab needs    Follow Up Recommendations  Follow surgeon's recommendation for DC plan and follow-up therapies    Equipment Recommendations  None recommended by OT    Recommendations for Other Services       Precautions / Restrictions Precautions Precautions: Shoulder Type of Shoulder Precautions: sling on except for bathing dressing and exercise.  May move elbow, wrist and fingers Shoulder Interventions: Shoulder sling/immobilizer Precaution Booklet Issued: Yes (comment) Restrictions Other Position/Activity Restrictions: LUE NWB      Mobility Bed Mobility                  Transfers Overall transfer level: Independent                    Balance                                           ADL either performed or assessed with clinical judgement   ADL Overall ADL's : Needs assistance/impaired Eating/Feeding: Set up   Grooming: Set up   Upper Body Bathing: Moderate assistance   Lower Body Bathing: Maximal assistance   Upper Body Dressing : Maximal assistance   Lower Body Dressing: Maximal assistance   Toilet Transfer: Min guard;Supervision/safety;Ambulation;Comfort height toilet   Toileting- Clothing Manipulation and Hygiene: Moderate assistance         General ADL Comments: reviewed shoulder protocol, performed finger and wrist movement, dressed and ambulated to bathroom for toileting.  Son present and both verbalize understanding of education     Vision         Perception     Praxis      Pertinent Vitals/Pain  Pain Assessment: No/denies pain     Hand Dominance Right   Extremity/Trunk Assessment Upper Extremity Assessment Upper Extremity Assessment: LUE deficits/detail LUE Deficits / Details: able to move fingers wrist and hand           Communication Communication Communication: No difficulties   Cognition Arousal/Alertness: Awake/alert Behavior During Therapy: WFL for tasks assessed/performed Overall Cognitive Status: Within Functional Limits for tasks assessed                                     General Comments       Exercises     Shoulder Instructions      Home Living Family/patient expects to be discharged to:: Private residence Living Arrangements: Alone Available Help at Discharge: Family               Bathroom Shower/Tub: Teacher, early years/pre: Handicapped height     Home Equipment: Shower seat          Prior Functioning/Environment Level of Independence: Independent                 OT Problem List:        OT Treatment/Interventions:  OT Goals(Current goals can be found in the care plan section) Acute Rehab OT Goals Patient Stated Goal: good recovery OT Goal Formulation: All assessment and education complete, DC therapy  OT Frequency:     Barriers to D/C:            Co-evaluation              AM-PAC OT "6 Clicks" Daily Activity     Outcome Measure Help from another person eating meals?: A Little Help from another person taking care of personal grooming?: A Little Help from another person toileting, which includes using toliet, bedpan, or urinal?: A Lot Help from another person bathing (including washing, rinsing, drying)?: A Lot Help from another person to put on and taking off regular upper body clothing?: A Lot Help from another person to put on and taking off regular lower body clothing?: A Lot 6 Click Score: 14   End of Session    Activity Tolerance: Patient tolerated treatment  well Patient left: in chair;with call bell/phone within reach;with family/visitor present  OT Visit Diagnosis: Muscle weakness (generalized) (M62.81)                Time: 6063-0160 OT Time Calculation (min): 24 min Charges:  OT General Charges $OT Visit: 1 Visit OT Evaluation $OT Eval Low Complexity: 1 Low OT Treatments $Self Care/Home Management : 8-22 mins  Samba Cumba S, OTR/L Acute Rehabilitation Services 01/14/2020  Akin Yi 01/14/2020, 12:47 PM

## 2020-01-14 NOTE — Anesthesia Procedure Notes (Signed)
Anesthesia Regional Block: Interscalene brachial plexus block   Pre-Anesthetic Checklist: ,, timeout performed, Correct Patient, Correct Site, Correct Laterality, Correct Procedure, Correct Position, site marked, Risks and benefits discussed,  Surgical consent,  Pre-op evaluation,  At surgeon's request and post-op pain management  Laterality: Left  Prep: Maximum Sterile Barrier Precautions used, chloraprep       Needles:  Injection technique: Single-shot  Needle Type: Echogenic Needle     Needle Length: 5cm  Needle Gauge: 21     Additional Needles:   Procedures:,,,, ultrasound used (permanent image in chart),,,,  Narrative:  Start time: 01/14/2020 6:52 AM End time: 01/14/2020 7:00 AM Injection made incrementally with aspirations every 5 mL.  Performed by: Personally  Anesthesiologist: Trevor Iha, MD  Additional Notes: Block assessed prior to procedure. Patient tolerated procedure well.

## 2020-01-14 NOTE — Anesthesia Postprocedure Evaluation (Signed)
Anesthesia Post Note  Patient: Kimberly Baldwin  Procedure(s) Performed: REVERSE SHOULDER ARTHROPLASTY (Left Shoulder)     Anesthesia Post Evaluation  Last Vitals:  Vitals:   01/14/20 1030 01/14/20 1045  BP: (!) 145/59 (!) 148/60  Pulse: (!) 52 (!) 49  Resp: 15 12  Temp:    SpO2: 95% 93%    Last Pain:  Vitals:   01/14/20 1105  TempSrc:   PainSc: 0-No pain                 Trevor Iha

## 2020-01-14 NOTE — Transfer of Care (Signed)
Immediate Anesthesia Transfer of Care Note  Patient: Kimberly Baldwin  Procedure(s) Performed: Procedure(s): REVERSE SHOULDER ARTHROPLASTY (Left)  Patient Location: PACU  Anesthesia Type:General  Level of Consciousness: Alert, Awake, Oriented  Airway & Oxygen Therapy: Patient Spontanous Breathing  Post-op Assessment: Report given to RN  Post vital signs: Reviewed and stable  Last Vitals:  Vitals:   01/14/20 0629 01/14/20 1015  BP: 137/69 (!) 136/57  Pulse: (!) 58 (!) (P) 52  Resp: 16 20  Temp: 36.8 C (!) (P) 36.4 C  SpO2: 99% (P) 94%    Complications: No apparent anesthesia complications

## 2020-01-14 NOTE — Discharge Instructions (Signed)
Diet: As you were doing prior to hospitalization   Shower:  May shower but keep the wounds dry, use an occlusive plastic wrap, NO SOAKING IN TUB.  If the bandage gets wet, change with a clean dry gauze.  If you have a splint on, leave the splint in place and keep the splint dry with a plastic bag.  Dressing:  You may change your dressing 3-5 days after surgery, unless you have a splint.  If you have a splint, then just leave the splint in place and we will change your bandages during your first follow-up appointment.    There are sticky tapes (steri-strips) on your wounds and all the stitches are absorbable.  Leave the steri-strips in place when changing your dressings, they will peel off with time, usually 2-3 weeks.  Activity:  Increase activity slowly as tolerated, but follow the weight bearing instructions below.  The rules on driving is that you can not be taking narcotics while you drive, and you must feel in control of the vehicle.    Weight Bearing:   Non-weight bearing in left arm.   To prevent constipation: you may use a stool softener such as -  Colace (over the counter) 100 mg by mouth twice a day  Drink plenty of fluids (prune juice may be helpful) and high fiber foods Miralax (over the counter) for constipation as needed.    Itching:  If you experience itching with your medications, try taking only a single pain pill, or even half a pain pill at a time.  You may take up to 10 pain pills per day, and you can also use benadryl over the counter for itching or also to help with sleep.   Precautions:  If you experience chest pain or shortness of breath - call 911 immediately for transfer to the hospital emergency department!!  If you develop a fever greater that 101 F, purulent drainage from wound, increased redness or drainage from wound, or calf pain -- Call the office at [phone]                                                Follow- Up Appointment:  Please call for an  appointment to be seen in 2 weeks [place] - [phone]

## 2020-01-14 NOTE — H&P (Signed)
PREOPERATIVE H&P  Chief Complaint: left shoulder pain   HPI: Kimberly Baldwin is a 74 y.o. female who presents for preoperative history and physical with a diagnosis of left shoulder rotator cuff arthropathy.. Symptoms are rated as moderate to severe, and have been worsening.  This is significantly impairing activities of daily living.  She has elected for surgical management.   She has failed injections, activity modification, anti-inflammatories, and assistive devices.  Preoperative imaging shows rotator cuff arthropathy.  She has done well with the other side.   Past Medical History:  Diagnosis Date  . Aortic valve stenosis   . Arthritis   . CAD (coronary artery disease)   . CRF (chronic renal failure), stage 3 (moderate)   . Diabetes ([place])   . GERD (gastroesophageal reflux disease)   . Guaiac positive stools 07/25/2018  . Headache   . High cholesterol   . History of hiatal hernia   . Hypertension   . IDA (iron deficiency anemia)   . Right rotator cuff tear arthropathy 06/11/2019   Past Surgical History:  Procedure Laterality Date  . Cataract surgery    . CHOLECYSTECTOMY    . COLONOSCOPY N/A 09/27/2018   Procedure: COLONOSCOPY;  Surgeon: Rogene Houston, MD;  Location: [facility]ITE;  Service: Endoscopy;  Laterality: N/A;  2:00  . complete hyste    . CORONARY ANGIOPLASTY    . REPLACEMENT TOTAL KNEE     both knee, 2010, 2015  . REVERSE SHOULDER ARTHROPLASTY Right 06/11/2019   Procedure: REVERSE SHOULDER ARTHROPLASTY;  Surgeon: Marchia Bond, MD;  Location: [facility];  Service: Orthopedics;  Laterality: Right;   Social History   Socioeconomic History  . Marital status: Widowed    Spouse name: Not on file  . Number of children: Not on file  . Years of education: Not on file  . Highest education level: Not on file  Occupational History  . Not on file  Tobacco Use  . Smoking status: Never Smoker  . Smokeless tobacco: Never Used  Substance and Sexual Activity  .  Alcohol use: Never  . Drug use: Never  . Sexual activity: Not on file  Other Topics Concern  . Not on file  Social History Narrative  . Not on file   Social Determinants of Health   Financial Resource Strain: Low Risk   . Difficulty of Paying Living Expenses: Not hard at all  Food Insecurity: Unknown  . Worried About Charity fundraiser in the Last Year: Patient refused  . Ran Out of Food in the Last Year: Patient refused  Transportation Needs: Unknown  . Lack of Transportation (Medical): Patient refused  . Lack of Transportation (Non-Medical): Patient refused  Physical Activity: Unknown  . Days of Exercise per Week: Patient refused  . Minutes of Exercise per Session: Patient refused  Stress: No Stress Concern Present  . Feeling of Stress : Not at all  Social Connections: Moderately Isolated  . Frequency of Communication with Friends and Family: Once a week  . Frequency of Social Gatherings with Friends and Family: Once a week  . Attends Religious Services: 1 to 4 times per year  . Active Member of Clubs or Organizations: Patient refused  . Attends Archivist Meetings: Never  . Marital Status: Widowed   History reviewed. No pertinent family history. Allergies  Allergen Reactions  . Diclofenac Nausea Only  . Mobic [Meloxicam] Nausea Only   Prior to Admission medications   Medication Sig Start Date End  Date Taking? Authorizing Provider  acetaminophen (TYLENOL) 500 MG tablet Take 1,000 mg by mouth 2 (two) times daily before a meal.   Yes [provider]  allopurinol (ZYLOPRIM) 100 MG tablet Take 100 mg by mouth daily.    Yes [provider]  amLODipine (NORVASC) 10 MG tablet Take 10 mg by mouth daily.   Yes [provider]  aspirin EC 81 MG tablet Take 81 mg by mouth daily.   Yes [provider]  atorvastatin (LIPITOR) 40 MG tablet Take 40 mg by mouth daily.   Yes [provider]  benazepril (LOTENSIN) 20 MG tablet Take  20 mg by mouth daily.   Yes [provider]  carvedilol (COREG) 25 MG tablet Take 25 mg by mouth 2 (two) times daily with a meal.   Yes [provider]  clopidogrel (PLAVIX) 75 MG tablet Take 75 mg by mouth daily.   Yes [provider]  famotidine (PEPCID) 10 MG tablet Take 10 mg by mouth 2 (two) times daily.   Yes [provider]  fluticasone (VERAMYST) 27.5 MCG/SPRAY nasal spray Place 2 sprays into the nose as needed for rhinitis.   Yes [provider]  folic acid (FOLVITE) 1 MG tablet Take 1 mg by mouth daily.   Yes [provider]  GARLIC PO Take 1 mg by mouth daily.   Yes [provider]  liraglutide (VICTOZA) 18 MG/3ML SOPN Inject 0.6 mg into the skin daily.   Yes [provider]  magnesium oxide (MAG-OX) 400 MG tablet Take 400 mg by mouth daily.   Yes [provider]  Multiple Vitamin (MULTIVITAMIN) tablet Take 1 tablet by mouth daily.   Yes [provider]  Omega-3 Fatty Acids (FISH OIL) 1000 MG CAPS Take 2,000 mg by mouth 2 (two) times daily.    Yes [provider]  pioglitazone (ACTOS) 30 MG tablet Take 30 mg by mouth daily.    Yes [provider]  torsemide (DEMADEX) 20 MG tablet Take 20 mg by mouth daily.   Yes [provider]  traMADol (ULTRAM) 50 MG tablet Take 1 tablet (50 mg total) by mouth every 6 (six) hours as needed. 06/11/19  Yes Teryl Lucy, MD  vitamin B-12 (CYANOCOBALAMIN) 1000 MCG tablet Take 1,000 mcg by mouth daily.    Yes [provider]  azithromycin (ZITHROMAX) 250 MG tablet Take 1,000 mg by mouth See admin instructions. Take 1000 mg by mouth 1 hour prior to dental appointments    [provider]  Calcium Carbonate-Vit D-Min (CALCIUM 1200 PO) Take 600 mg by mouth 2 (two) times daily.     [provider]  HYDROcodone-acetaminophen (NORCO) 10-325 MG tablet Take 1 tablet by mouth every 6 (six) hours as needed. Patient not  taking: Reported on 01/06/2020 06/12/19   Teryl Lucy, MD  ondansetron (ZOFRAN) 4 MG tablet Take 1 tablet (4 mg total) by mouth every 8 (eight) hours as needed for nausea or vomiting. Patient not taking: Reported on 01/06/2020 06/12/19   Teryl Lucy, MD  sennosides-docusate sodium (SENOKOT-S) 8.6-50 MG tablet Take 2 tablets by mouth daily. Patient not taking: Reported on 01/06/2020 06/12/19   Teryl Lucy, MD     Positive ROS: All other systems have been reviewed and were otherwise negative with the exception of those mentioned in the HPI and as above.  Physical Exam: General: Alert, no acute distress Cardiovascular: No pedal edema Respiratory: No cyanosis, no use of accessory musculature GI: No organomegaly, abdomen  is soft and non-tender Skin: No lesions in the area of chief complaint Neurologic: Sensation intact distally Psychiatric: Patient is competent for consent with normal mood and affect Lymphatic: No axillary or cervical lymphadenopathy  MUSCULOSKELETAL: 0-30 with profound weakness and crepitance.   Assessment: Left rotator cuff arthropathy   Plan: Plan for Procedure(s): REVERSE SHOULDER ARTHROPLASTY  The risks benefits and alternatives were discussed with the patient including but not limited to the risks of nonoperative treatment, versus surgical intervention including infection, bleeding, nerve injury,  blood clots, cardiopulmonary complications, morbidity, mortality, among others, and they were willing to proceed.    Patient's anticipated LOS is less than 2 midnights, meeting these requirements: - Younger than 38 - Lives within 1 hour of care - Has a competent adult at home to recover with post-op recover - NO history of  - Chronic pain requiring opiods  - Diabetes  - Coronary Artery Disease  - Heart failure  - Heart attack  - Stroke  - DVT/VTE  - Cardiac arrhythmia  - Respiratory Failure/COPD  - Renal failure  - Anemia  - Advanced Liver  disease        Eulas Post, MD Cell [phone]   01/14/2020 7:22 AM

## 2020-01-14 NOTE — Op Note (Signed)
01/14/2020  9:43 AM  PATIENT:  Kimberly Baldwin    PRE-OPERATIVE DIAGNOSIS: Left rotator cuff arthropathy  POST-OPERATIVE DIAGNOSIS:  Same  PROCEDURE:  Reverse Total Shoulder Arthroplasty  SURGEON:  Johnny Bridge, MD  PHYSICIAN ASSISTANT: Merlene Pulling, PA-C, present and scrubbed throughout the case, critical for completion in a timely fashion, and for retraction, instrumentation, and closure.  ANESTHESIA:   General with interscalene block using Exparel  ESTIMATED BLOOD LOSS: 200 mL  UNIQUE ASPECTS OF THE CASE: I made a deeper cut on the humeral side to begin with, after having experienced her contralateral side.  This gave me good access to the glenoid.  The medium augment had excellent preparation and fit and restored the inclination appropriately.  I had a little bit of difficulty fully seating the glenosphere, I went back and rechecked the central screw and I had not completely see the big central screw, such that I was then ultimately able to see the glenosphere with solid fixation.  Despite an inferior neck cut, the shoulder had 2 finger tightness at the completion of the case and felt stable although the reduction was fairly easy, but the stem felt stable and articulation was appropriate.  The subscapularis and supraspinatus were nowhere to be found, the biceps was present for tenodesis.  I was able to ream up to a size 10, but the broach was very tight at a size 9 distally, and so I stuck with the size 9, particular because the contralateral side of the bone appropriately.  PREOPERATIVE INDICATIONS:  Kimberly Baldwin is a  74 y.o. female with a diagnosis of left shoulder rotator cuff arthropathy who failed conservative measures and elected for surgical management.    The risks benefits and alternatives were discussed with the patient preoperatively including but not limited to the risks of infection, bleeding, nerve injury, cardiopulmonary complications, the need for revision  surgery, dislocation, brachial plexus palsy, incomplete relief of pain, among others, and the patient was willing to proceed.  OPERATIVE IMPLANTS: Biomet size 9 humeral stem press-fit micro with a 40 mm reverse shoulder arthroplasty tray with a standard +0 tapered offset liner and a 36 mm glenosphere with a mini with superior augment baseplate and 4 locking screws and one central nonlocking screw.  OPERATIVE FINDINGS: Advanced rotator cuff arthropathy, superior wear, absent subscapularis and supraspinatus.  OPERATIVE PROCEDURE: The patient was brought to the operating room and placed in the supine position. General anesthesia was administered. IV antibiotics were given.  Time out was performed. The upper extremity was prepped and draped in usual sterile fashion. The patient was in a beachchair position. Deltopectoral approach was carried out. The biceps was tenodesed to the pectoralis tendon with #2 Fiberwire. The anterior capsule was released off of the bone.   I then performed circumferential releases of the humerus, and then dislocated the head, and then reamed with the reamer to the above named size.  I then applied the jig, and cut the humeral head in 30 of retroversion, and then turned my attention to the glenoid.  Deep retractors were placed, and I resected the labrum, and then placed a guidepin into the center position on the glenoid, with slight inferior inclination. I then reamed over the guidepin, and this created a small metaphyseal cancellus blush inferiorly, removing just the cartilage to the subchondral bone superiorly.  I performed standard preparation for the superior augment, and had 100% bony contact with the back of the implant.  The base plate was selected  and impacted place, and then I secured it centrally with a nonlocking screw, and I had excellent purchase both inferiorly and superiorly. I placed a short locking screws on anterior and posterior aspects.  I then turned my  attention to the glenosphere, and impacted this into place, placing slight inferior offset (set on B).   The glenosphere was completely seated, and had engagement of the [facility] taper. I then turned my attention back to the humerus.  I sequentially broached, and then trialed, and was found to restore soft tissue tension, and it had 2 finger tightness. Therefore the above named components were selected. The shoulder felt stable throughout functional motion.  There was no subscapularis for repair.  I then impacted the real prosthesis into place, as well as the real humeral tray, and reduced the shoulder. The shoulder had excellent motion, and was stable, and I irrigated the wounds copiously.   I then irrigated the shoulder copiously once more, repaired the deltopectoral interval with Vicryl followed by subcutaneous Vicryl with Steri-Strips and sterile gauze for the skin. The patient was awakened and returned back in stable and satisfactory condition. There were no complications and She tolerated the procedure well.

## 2020-01-14 NOTE — Anesthesia Procedure Notes (Signed)
Procedure Name: Intubation Date/Time: 01/14/2020 7:36 AM Performed by: Gerald Leitz, CRNA Pre-anesthesia Checklist: Patient identified, Patient being monitored, Timeout performed, Emergency Drugs available and Suction available Patient Re-evaluated:Patient Re-evaluated prior to induction Oxygen Delivery Method: Circle system utilized Preoxygenation: Pre-oxygenation with 100% oxygen Induction Type: IV induction Ventilation: Mask ventilation without difficulty Laryngoscope Size: Mac and 3 Grade View: Grade I Tube type: Oral Tube size: 7.5 mm Number of attempts: 1 Placement Confirmation: ETT inserted through vocal cords under direct vision,  positive ETCO2 and breath sounds checked- equal and bilateral Secured at: 21 cm Tube secured with: Tape Dental Injury: Teeth and Oropharynx as per pre-operative assessment

## 2020-01-16 ENCOUNTER — Encounter: Payer: Self-pay | Admitting: *Deleted

## 2020-06-14 IMAGING — CT CT SHOULDER*L* W/O CM
1 series · 12 of 14 positions shown, 15 images · non-contrast
Comparison: None.

CLINICAL DATA: Left shoulder pain with limited range of motion

EXAM:
CT OF THE UPPER LEFT EXTREMITY WITHOUT CONTRAST
TECHNIQUE: Multidetector CT imaging of the upper left extremity was performed
according to the standard protocol.

[Series 4: shoulder 2.00 br40 s3 axial soft · axial · 0.45mm/px · z∈[-881,-721]mm · 12 of 96 slices shown, 15 images]
[im 8/96  soft-tissue]
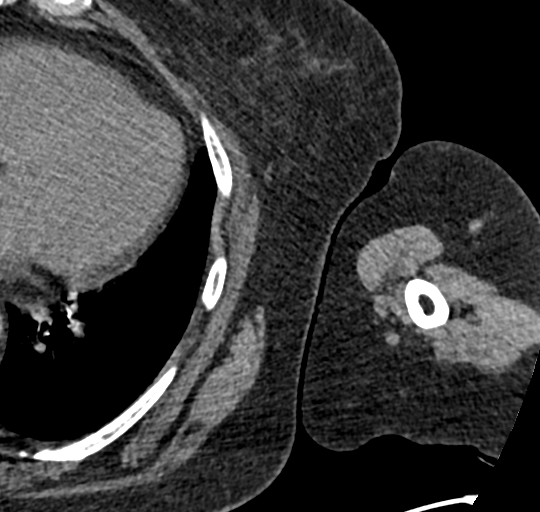
[im 8/96  bone]
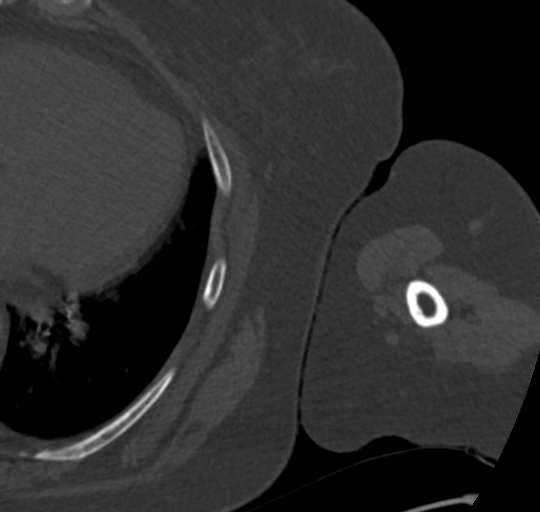
[im 15/96  bone]
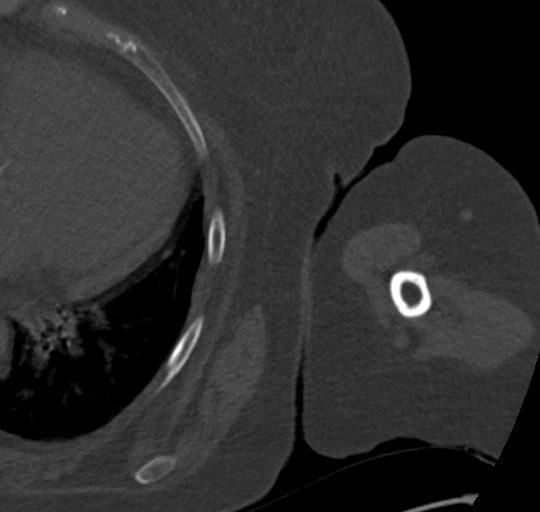
[im 22/96  bone]
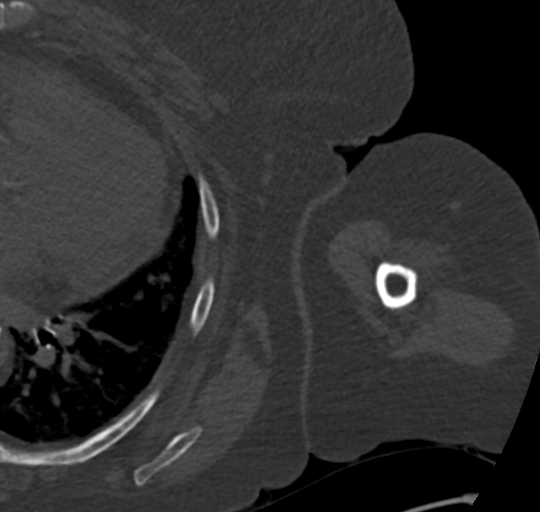
[im 30/96  bone]
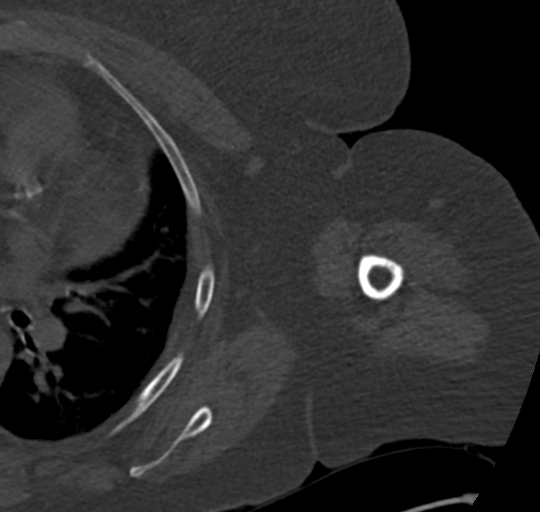
[im 37/96  soft-tissue]
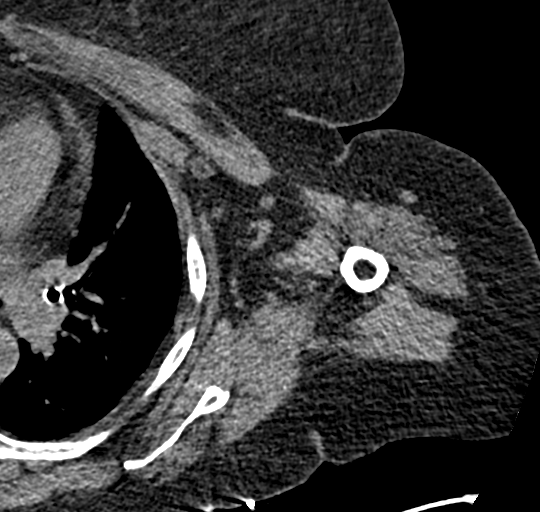
[im 37/96  bone]
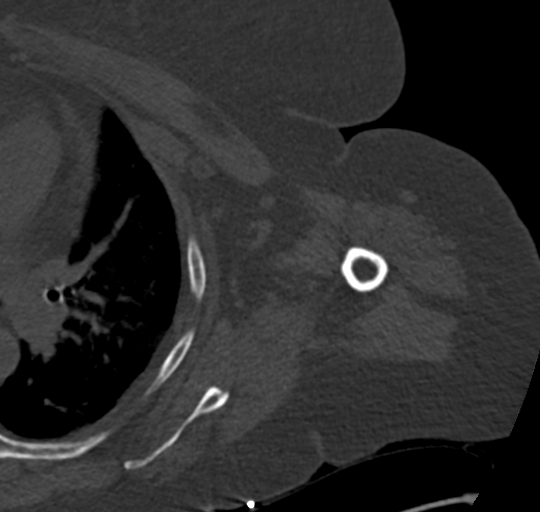
[im 44/96  bone]
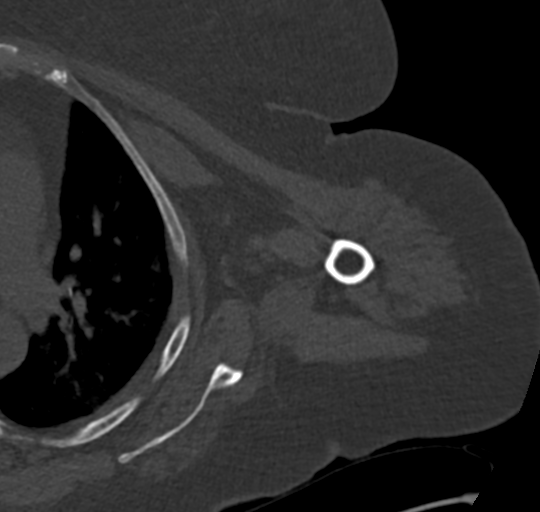
[im 52/96  bone]
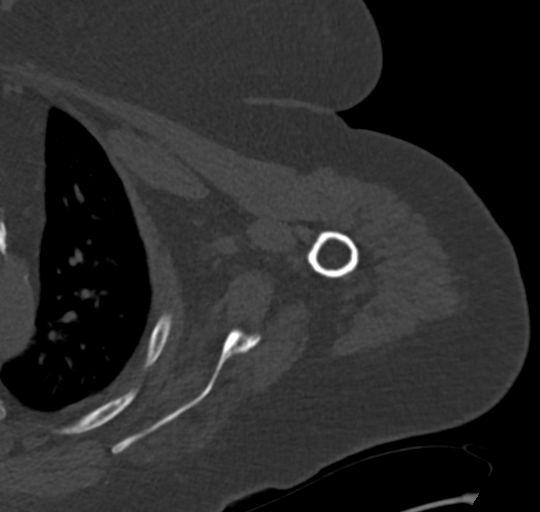
[im 59/96  bone]
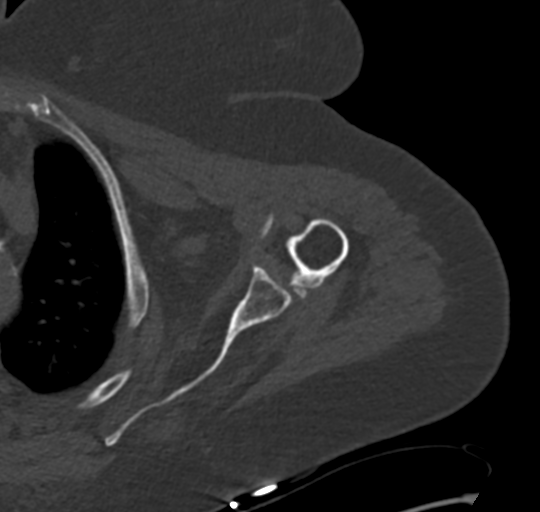
[im 66/96  soft-tissue]
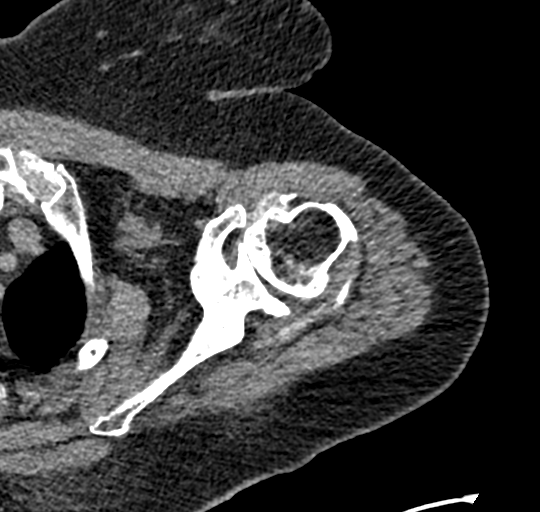
[im 66/96  bone]
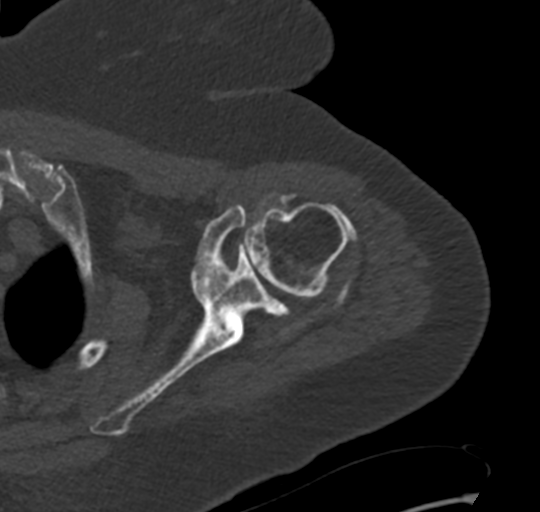
[im 74/96  bone]
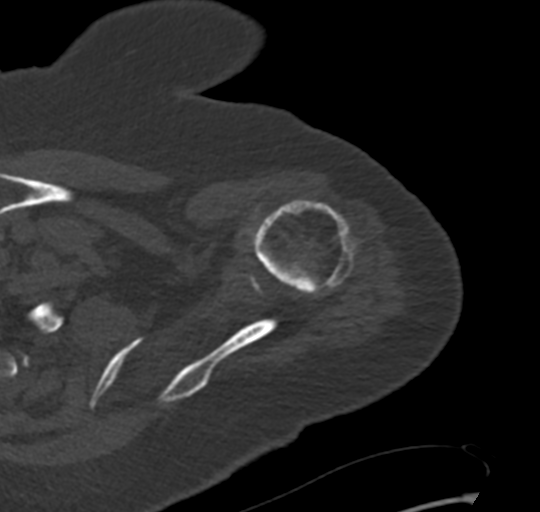
[im 81/96  bone]
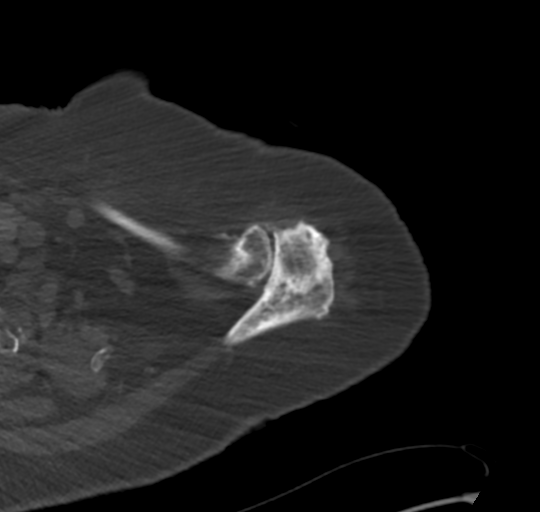
[im 88/96  bone]
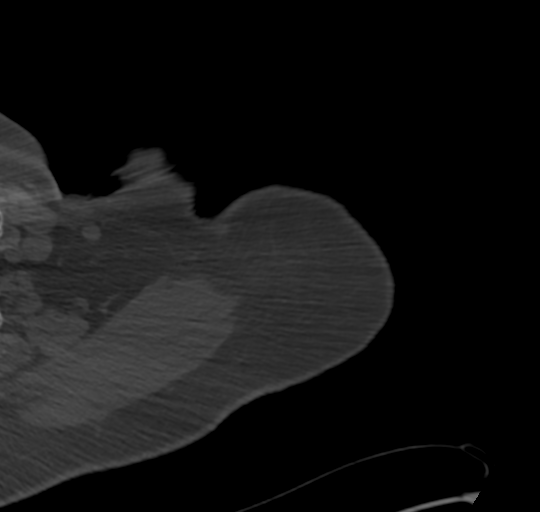

[12 of 14 positions shown; findings below may reference images not displayed]

FINDINGS: Bones/Joint/Cartilage

Severe degenerative changes of the left glenohumeral joint with
complete joint space loss, subchondral sclerosis/cystic change, and
large marginal osteophyte formation. Small loose bodies at the
posterior joint line and axillary pouch. No large glenohumeral joint
effusion. Humeral head is superiorly subluxed relative to the
glenoid and articulates with the undersurface of the acromion where
there is extensive bony remodeling. Moderate-severe degenerative
changes of the AC joint.

No acute fractures.  No suspicious bony lesions.

Ligaments

Suboptimally assessed by CT.

Muscles and Tendons

Atrophy and fatty infiltration of the supraspinatus, infraspinatus,
and subscapularis muscles suggesting chronic tears.

Soft tissues

No fluid collection, hematoma, or soft tissue mass. No acute
findings within the visualized left lung field.
IMPRESSION: 1. Severe osteoarthritis of the left glenohumeral joint.
2. Chronic rotator cuff tear with fatty atrophy of the
supraspinatus, infraspinatus, and subscapularis muscles.
